# Patient Record
Sex: Female | Born: 1995 | Race: Black or African American | Hispanic: No | Marital: Single | Smoking: Never smoker
Health system: Southern US, Community
[De-identification: ages and names within clinical notes are randomized; demographics above are authoritative.]

## PROBLEM LIST (undated history)

## (undated) DIAGNOSIS — Z9109 Other allergy status, other than to drugs and biological substances: Secondary | ICD-10-CM

## (undated) DIAGNOSIS — O1490 Unspecified pre-eclampsia, unspecified trimester: Secondary | ICD-10-CM

## (undated) DIAGNOSIS — D649 Anemia, unspecified: Secondary | ICD-10-CM

## (undated) DIAGNOSIS — O09299 Supervision of pregnancy with other poor reproductive or obstetric history, unspecified trimester: Secondary | ICD-10-CM

## (undated) DIAGNOSIS — J45909 Unspecified asthma, uncomplicated: Secondary | ICD-10-CM

## (undated) HISTORY — DX: Supervision of pregnancy with other poor reproductive or obstetric history, unspecified trimester: O09.299

## (undated) HISTORY — PX: HIP ARTHROSCOPY: SHX668

## (undated) HISTORY — PX: WISDOM TOOTH EXTRACTION: SHX21

## (undated) HISTORY — DX: Anemia, unspecified: D64.9

---
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
## 2015-04-10 DIAGNOSIS — O1490 Unspecified pre-eclampsia, unspecified trimester: Secondary | ICD-10-CM

## 2015-04-10 HISTORY — DX: Unspecified pre-eclampsia, unspecified trimester: O14.90

## 2015-09-09 DIAGNOSIS — O1490 Unspecified pre-eclampsia, unspecified trimester: Secondary | ICD-10-CM

## 2017-12-17 LAB — OB RESULTS CONSOLE RPR: RPR: NONREACTIVE

## 2017-12-17 LAB — OB RESULTS CONSOLE RUBELLA ANTIBODY, IGM: Rubella: IMMUNE

## 2017-12-17 LAB — OB RESULTS CONSOLE GC/CHLAMYDIA
Chlamydia: NEGATIVE
Gonorrhea: NEGATIVE

## 2017-12-17 LAB — OB RESULTS CONSOLE HEPATITIS B SURFACE ANTIGEN: Hepatitis B Surface Ag: NEGATIVE

## 2017-12-17 LAB — OB RESULTS CONSOLE ABO/RH: RH Type: POSITIVE

## 2017-12-17 LAB — OB RESULTS CONSOLE ANTIBODY SCREEN: Antibody Screen: NEGATIVE

## 2017-12-17 LAB — OB RESULTS CONSOLE HIV ANTIBODY (ROUTINE TESTING): HIV: NONREACTIVE

## 2017-12-18 LAB — HM PAP SMEAR: HM Pap smear: NEGATIVE

## 2018-02-11 ENCOUNTER — Other Ambulatory Visit (HOSPITAL_COMMUNITY): Payer: Self-pay | Admitting: Obstetrics and Gynecology

## 2018-02-11 ENCOUNTER — Encounter (HOSPITAL_COMMUNITY): Payer: Self-pay | Admitting: *Deleted

## 2018-02-11 DIAGNOSIS — O280 Abnormal hematological finding on antenatal screening of mother: Secondary | ICD-10-CM

## 2018-02-11 DIAGNOSIS — Z3A18 18 weeks gestation of pregnancy: Secondary | ICD-10-CM

## 2018-02-11 DIAGNOSIS — Z363 Encounter for antenatal screening for malformations: Secondary | ICD-10-CM

## 2018-02-12 ENCOUNTER — Ambulatory Visit (HOSPITAL_COMMUNITY)
Admission: RE | Admit: 2018-02-12 | Discharge: 2018-02-12 | Disposition: A | Discharge status: Home / Self Care | Payer: BLUE CROSS/BLUE SHIELD | Source: Ambulatory Visit | Attending: Obstetrics and Gynecology | Admitting: Obstetrics and Gynecology

## 2018-02-12 ENCOUNTER — Ambulatory Visit (HOSPITAL_COMMUNITY): Payer: Self-pay

## 2018-02-12 ENCOUNTER — Ambulatory Visit (HOSPITAL_BASED_OUTPATIENT_CLINIC_OR_DEPARTMENT_OTHER)
Admission: RE | Admit: 2018-02-12 | Discharge: 2018-02-12 | Disposition: A | Discharge status: Home / Self Care | Payer: BLUE CROSS/BLUE SHIELD | Source: Ambulatory Visit | Attending: Obstetrics and Gynecology | Admitting: Obstetrics and Gynecology

## 2018-02-12 ENCOUNTER — Encounter (HOSPITAL_COMMUNITY): Payer: Self-pay

## 2018-02-12 ENCOUNTER — Other Ambulatory Visit (HOSPITAL_COMMUNITY): Payer: Self-pay | Admitting: *Deleted

## 2018-02-12 DIAGNOSIS — O281 Abnormal biochemical finding on antenatal screening of mother: Secondary | ICD-10-CM | POA: Insufficient documentation

## 2018-02-12 DIAGNOSIS — Z3A17 17 weeks gestation of pregnancy: Secondary | ICD-10-CM | POA: Insufficient documentation

## 2018-02-12 DIAGNOSIS — O280 Abnormal hematological finding on antenatal screening of mother: Secondary | ICD-10-CM

## 2018-02-12 DIAGNOSIS — Z363 Encounter for antenatal screening for malformations: Secondary | ICD-10-CM | POA: Diagnosis not present

## 2018-02-12 DIAGNOSIS — O09292 Supervision of pregnancy with other poor reproductive or obstetric history, second trimester: Secondary | ICD-10-CM | POA: Diagnosis not present

## 2018-02-12 DIAGNOSIS — O289 Unspecified abnormal findings on antenatal screening of mother: Secondary | ICD-10-CM | POA: Diagnosis present

## 2018-02-12 DIAGNOSIS — O351XX0 Maternal care for (suspected) chromosomal abnormality in fetus, not applicable or unspecified: Secondary | ICD-10-CM | POA: Diagnosis present

## 2018-02-12 DIAGNOSIS — Z3A18 18 weeks gestation of pregnancy: Secondary | ICD-10-CM

## 2018-02-12 DIAGNOSIS — O3510X0 Maternal care for (suspected) chromosomal abnormality in fetus, unspecified, not applicable or unspecified: Secondary | ICD-10-CM

## 2018-02-12 NOTE — ED Notes (Signed)
Patient in session with Genetic Counselor, Derek Jack.

## 2018-02-12 NOTE — Consult Note (Signed)
Consultation:   Victoria Mills is a 22  yo African American female, G 2 P 1001 LMP 10/10/17 EDC 07/23/18 now @ 17 weeks seen in consultation as requested secondary to:  1) 1) Abnormal QUAD screen - 1/30 risk Trisomy 21    PREVIOUS OBSTETRICAL HISTORY:  1) 2017 - NSVD@ term, female, 7 lb 14 oz, complicated by pre-eclampsia     PREVIOUS GYN HISTORY:   Abnormal PAP - neg   GC - neg   Chlamydia - neg    Syphilis - neg   CAT - 12-13- x 28-30 x 5-7   Contraception - none    PREVIOUS MEDICAL HISTORY:  DM - neg   HTN - neg   Asthma - age 51   Thyroid - neg   Rheumatic Fever - neg    Heart - neg   Lung - neg   Liver - neg   Kidney - neg   Epilepsy - neg    TB - neg   Herpes - neg   UTI - neg        PREVIOUS SURGICAL HISTORY:  1) 2013 - Rt. Hip arthroscopy without complications    MEDICATIONS:  Prenatal Vitamins, Symbicort BID       ALLERGIES/REACTIONS:  None      HABITS:  Smoking - neg   Drinking - neg   Drugs - neg    PSYCHOSOCIAL:  Single; FOB is involved      PROFESSION:  UPS    FAMILY HISTORY:  DM - neg   HTN - F   Twins - cousins   Stillborns - neg    Birth Defects - neg   Mental Retardation - neg   Blood Dyscrasias - neg   Anesthesia Complications - neg    Genetic - neg          TRISOMY 21: Genetic counseling was then performed.  The association of Trisomy 67 with mental retardation and various birth defects was discussed.  The risks and benefits of CVS vs. amniocentesis including a 1 % loss for CVS vs. a 1/350 risk of pregnancy loss per amnio as well as management options were explained.   Results based on maternal serum screening are gestationally age dependent for accurate risk assessment.  80 % of fetuses with Trisomy 21 will have some marker for Trisomy 21 on U/S but 20 % will not.  Also discussed was the alternative non-invasive perinatal testing (NIPT) with cell free DNA which will identify 99.1 % of fetuses with Trisomy 21.  NIPT  desired.  IMPRESSIONS: 1) Abnormal QUAD Screen - 1/30 risk Trisomy 21    RECOMMENDATIONS:  1) NIPT 2) Completion of anatomy in 4 weeks             30 minutes spent in face-to-face consultation with greater than 50% of the time spent in counseling.    Thank you for utilizing our ultrasound and consultative services.  If I may be of any further service, please do not hesitate to contact me.  Sincerely,   Patsi Sears, MD Maternal-Fetal Medicine   Copy of report sent to practitioner/clinic.

## 2018-02-12 NOTE — ED Notes (Signed)
Patient declines genetic testing at this time. 

## 2018-02-13 ENCOUNTER — Ambulatory Visit (HOSPITAL_COMMUNITY): Admission: RE | Admit: 2018-02-13 | Payer: Managed Care, Other (non HMO) | Source: Ambulatory Visit

## 2018-02-20 ENCOUNTER — Other Ambulatory Visit (HOSPITAL_COMMUNITY): Payer: Self-pay

## 2018-02-20 ENCOUNTER — Encounter (HOSPITAL_COMMUNITY): Payer: Self-pay

## 2018-03-12 ENCOUNTER — Encounter (HOSPITAL_COMMUNITY): Payer: Self-pay

## 2018-03-12 ENCOUNTER — Other Ambulatory Visit (HOSPITAL_COMMUNITY): Payer: Self-pay | Admitting: *Deleted

## 2018-03-12 ENCOUNTER — Ambulatory Visit (HOSPITAL_COMMUNITY)
Admission: RE | Admit: 2018-03-12 | Discharge: 2018-03-12 | Disposition: A | Discharge status: Home / Self Care | Payer: BLUE CROSS/BLUE SHIELD | Source: Ambulatory Visit | Attending: Obstetrics and Gynecology | Admitting: Obstetrics and Gynecology

## 2018-03-12 DIAGNOSIS — O280 Abnormal hematological finding on antenatal screening of mother: Secondary | ICD-10-CM

## 2018-03-12 DIAGNOSIS — Z3A21 21 weeks gestation of pregnancy: Secondary | ICD-10-CM | POA: Insufficient documentation

## 2018-03-12 DIAGNOSIS — O281 Abnormal biochemical finding on antenatal screening of mother: Secondary | ICD-10-CM | POA: Insufficient documentation

## 2018-03-12 DIAGNOSIS — Z362 Encounter for other antenatal screening follow-up: Secondary | ICD-10-CM | POA: Diagnosis not present

## 2018-03-12 DIAGNOSIS — O09292 Supervision of pregnancy with other poor reproductive or obstetric history, second trimester: Secondary | ICD-10-CM | POA: Insufficient documentation

## 2018-04-09 NOTE — L&D Delivery Note (Signed)
Delivery Note   Patient Name: Victoria Mills DOB: [birth year] MRN: [id number]  Date of admission: 07/25/2018 Delivering MD: Dale   Date of delivery: 07/25/18 Type of delivery: SVD  Newborn Data: Live born female  Birth Weight:   APGAR: 8, 9  Newborn Delivery   Birth date/time:  07/25/2018 20:27:00 Delivery type:  Vaginal, Spontaneous       Dahlia Bailiff, 23 y.o., @ [redacted]w[redacted]d,  [id number], who was admitted for elective IOL. I was called to the room when she progressed +2 station in the second stage of labor.  She pushed for 20/min.  She delivered a viable infant, cephalic and restituted to the LOA position over an intact perineum.  A nuchal cord   was not identified. The baby was placed on maternal abdomen while initial step of NRP were perfmored (Dry, Stimulated, and warmed). Hat placed on baby for thermoregulation. Delayed cord clamping was performed for 2 minutes.  Cord double clamped and cut.  Cord cut by father. Apgar scores were 8 and 9. Prophylactic Pitocin was started in the third stage of labor for active management. The placenta delivered spontaneously, shultz, with a 3 vessel cord and was sent to LD.  Inspection revealed none. An examination of the vaginal vault and cervix was free from lacerations. The uterus was firm, bleeding stable. Placenta and umbilical artery blood gas were not sent.  There were no complications during the procedure.  Mom and baby skin to skin following delivery. Left in stable condition.   Maternal Info: Anesthesia :Epidural had been placed not not working after pt started pushing  Episiotomy: no Lacerations:  no Suture Repair: no Est. Blood Loss (mL):  109  Newborn Info:  Baby Sex: female Circumcision: N/A Name: Kynleigh APGAR (1 MIN): 8   APGAR (5 MINS): 9   APGAR (10 MINS):     Mom to postpartum.  Baby to Couplet care / Skin to Skin.  Dr Sallye Ober updated on delivery    [place], [place], NP-C 07/25/18 8:49 PM

## 2018-04-10 ENCOUNTER — Encounter (HOSPITAL_COMMUNITY): Payer: Self-pay

## 2018-04-10 ENCOUNTER — Ambulatory Visit (HOSPITAL_COMMUNITY)
Admission: RE | Admit: 2018-04-10 | Discharge: 2018-04-10 | Disposition: A | Discharge status: Home / Self Care | Payer: BLUE CROSS/BLUE SHIELD | Source: Ambulatory Visit | Attending: Obstetrics and Gynecology | Admitting: Obstetrics and Gynecology

## 2018-04-10 ENCOUNTER — Other Ambulatory Visit (HOSPITAL_COMMUNITY): Payer: Self-pay | Admitting: *Deleted

## 2018-04-10 DIAGNOSIS — Z362 Encounter for other antenatal screening follow-up: Secondary | ICD-10-CM | POA: Diagnosis not present

## 2018-04-10 DIAGNOSIS — O09292 Supervision of pregnancy with other poor reproductive or obstetric history, second trimester: Secondary | ICD-10-CM

## 2018-04-10 DIAGNOSIS — O280 Abnormal hematological finding on antenatal screening of mother: Secondary | ICD-10-CM

## 2018-04-10 DIAGNOSIS — O281 Abnormal biochemical finding on antenatal screening of mother: Secondary | ICD-10-CM | POA: Diagnosis present

## 2018-04-10 DIAGNOSIS — Z3A25 25 weeks gestation of pregnancy: Secondary | ICD-10-CM

## 2018-05-08 ENCOUNTER — Encounter (HOSPITAL_COMMUNITY): Payer: Self-pay

## 2018-05-08 ENCOUNTER — Ambulatory Visit (HOSPITAL_COMMUNITY): Payer: BLUE CROSS/BLUE SHIELD

## 2018-06-30 LAB — OB RESULTS CONSOLE GBS: GBS: NEGATIVE

## 2018-07-01 ENCOUNTER — Other Ambulatory Visit: Payer: Self-pay

## 2018-07-01 ENCOUNTER — Encounter (HOSPITAL_COMMUNITY): Payer: Self-pay

## 2018-07-01 ENCOUNTER — Inpatient Hospital Stay (HOSPITAL_COMMUNITY)
Admission: AD | Admit: 2018-07-01 | Discharge: 2018-07-01 | Disposition: A | Discharge status: Home / Self Care | Payer: BLUE CROSS/BLUE SHIELD | Attending: Obstetrics & Gynecology | Admitting: Obstetrics & Gynecology

## 2018-07-01 DIAGNOSIS — H539 Unspecified visual disturbance: Secondary | ICD-10-CM | POA: Insufficient documentation

## 2018-07-01 DIAGNOSIS — R51 Headache: Secondary | ICD-10-CM | POA: Insufficient documentation

## 2018-07-01 DIAGNOSIS — Z79899 Other long term (current) drug therapy: Secondary | ICD-10-CM | POA: Diagnosis not present

## 2018-07-01 DIAGNOSIS — O9989 Other specified diseases and conditions complicating pregnancy, childbirth and the puerperium: Secondary | ICD-10-CM

## 2018-07-01 DIAGNOSIS — H538 Other visual disturbances: Secondary | ICD-10-CM | POA: Diagnosis present

## 2018-07-01 DIAGNOSIS — M549 Dorsalgia, unspecified: Secondary | ICD-10-CM | POA: Insufficient documentation

## 2018-07-01 DIAGNOSIS — R109 Unspecified abdominal pain: Secondary | ICD-10-CM | POA: Insufficient documentation

## 2018-07-01 DIAGNOSIS — Z3689 Encounter for other specified antenatal screening: Secondary | ICD-10-CM

## 2018-07-01 DIAGNOSIS — Z7982 Long term (current) use of aspirin: Secondary | ICD-10-CM | POA: Insufficient documentation

## 2018-07-01 DIAGNOSIS — O26893 Other specified pregnancy related conditions, third trimester: Secondary | ICD-10-CM | POA: Diagnosis not present

## 2018-07-01 DIAGNOSIS — Z3A36 36 weeks gestation of pregnancy: Secondary | ICD-10-CM | POA: Insufficient documentation

## 2018-07-01 DIAGNOSIS — J45909 Unspecified asthma, uncomplicated: Secondary | ICD-10-CM | POA: Insufficient documentation

## 2018-07-01 DIAGNOSIS — R52 Pain, unspecified: Secondary | ICD-10-CM | POA: Diagnosis present

## 2018-07-01 LAB — CBC
HCT: 28.2 % — ABNORMAL LOW (ref 36.0–46.0)
Hemoglobin: 9.1 g/dL — ABNORMAL LOW (ref 12.0–15.0)
MCH: 27.2 pg (ref 26.0–34.0)
MCHC: 32.3 g/dL (ref 30.0–36.0)
MCV: 84.4 fL (ref 80.0–100.0)
NRBC: 0.3 % — AB (ref 0.0–0.2)
PLATELETS: 312 10*3/uL (ref 150–400)
RBC: 3.34 MIL/uL — ABNORMAL LOW (ref 3.87–5.11)
RDW: 12.5 % (ref 11.5–15.5)
WBC: 10.4 10*3/uL (ref 4.0–10.5)

## 2018-07-01 LAB — COMPREHENSIVE METABOLIC PANEL
ALT: 7 U/L (ref 0–44)
AST: 18 U/L (ref 15–41)
Albumin: 2.3 g/dL — ABNORMAL LOW (ref 3.5–5.0)
Alkaline Phosphatase: 128 U/L — ABNORMAL HIGH (ref 38–126)
Anion gap: 11 (ref 5–15)
CHLORIDE: 102 mmol/L (ref 98–111)
CO2: 22 mmol/L (ref 22–32)
Calcium: 8.6 mg/dL — ABNORMAL LOW (ref 8.9–10.3)
Creatinine, Ser: 0.55 mg/dL (ref 0.44–1.00)
GFR calc Af Amer: >60 mL/min (ref >60)
GFR calc non Af Amer: >60 mL/min (ref >60)
Glucose, Bld: 109 mg/dL — ABNORMAL HIGH (ref 70–99)
POTASSIUM: 3.4 mmol/L — AB (ref 3.5–5.1)
Sodium: 135 mmol/L (ref 135–145)
TOTAL PROTEIN: 6.1 g/dL — AB (ref 6.5–8.1)
Total Bilirubin: 0.7 mg/dL (ref 0.3–1.2)

## 2018-07-01 LAB — PROTEIN / CREATININE RATIO, URINE
Creatinine, Urine: 148.97 mg/dL
Protein Creatinine Ratio: 0.17 mg/mg{Cre} — ABNORMAL HIGH (ref 0.00–0.15)
TOTAL PROTEIN, URINE: 25 mg/dL

## 2018-07-01 NOTE — MAU Note (Signed)
First time at work, was having double vision, started seeing bright spots.  Started hurting all over. Back, upper chest, and stomach, was having small contractions.  Feet are kind of swollen.  Has hx of pre-eclampsia.  No problems with this preg, is taking daily baby aspirin. Has had a slight HA off and on, not currently. Denies epigastric pain? Sharp pain in rib cage

## 2018-07-01 NOTE — MAU Note (Signed)
Urine sent to lab 

## 2018-07-01 NOTE — MAU Provider Note (Addendum)
History     CSN: [id number]  Arrival date and time: 07/01/18 1747   None     Chief Complaint  Patient presents with  . visual changes  . Abdominal Pain  . Back Pain   HPI   Victoria Mills is 23 y.o. G47P1001 female at [redacted]w[redacted]d who presents for concern for Pre-Eclampsia. Patient with h/o Pre-E with prior pregnancy. No HTN this pregnancy. Takes baby ASA daily. She has a history of migraines. Today she had vision changes and headache. Both resolved without treatment and she is currently asymptomatic. She also complains of back pain and "pain all over." She receives her [facility] with [facility].  OB History    Gravida  2   Para  1   Term  1   Preterm      AB      Living  1     SAB      TAB      Ectopic      Multiple      Live Births              Past Medical History:  Diagnosis Date  . Asthma   . Environmental allergies     Past Surgical History:  Procedure Laterality Date  . HIP ARTHROSCOPY      History reviewed. No pertinent family history.  Social History   Tobacco Use  . Smoking status: Never Smoker  . Smokeless tobacco: Never Used  Substance Use Topics  . Alcohol use: No  . Drug use: No    Allergies: No Known Allergies  Medications Prior to Admission  Medication Sig Dispense Refill Last Dose  . ALBUTEROL IN Inhale into the lungs.   07/01/2018 at Unknown time  . Budesonide-Formoterol Fumarate (SYMBICORT IN) Inhale into the lungs.   07/01/2018 at Unknown time  . LORATADINE PO Take by mouth.   07/01/2018 at Unknown time  . Prenatal Vit-Fe Fumarate-FA (PRENATAL VITAMIN PO) Take by mouth.   07/01/2018 at Unknown time  . meloxicam (MOBIC) 15 MG tablet Take 1 tablet (15 mg total) by mouth daily as needed for pain. (Patient not taking: Reported on 02/12/2018) 30 tablet 0 More than a month at Unknown time  . minocycline (MINOCIN) 100 MG capsule Take 1 capsule (100 mg total) by mouth 2 (two) times daily. (Patient not taking: Reported on 02/12/2018) 20 capsule 0 More  than a month at Unknown time    Review of Systems  Constitutional: Negative for chills and fever.  Eyes: Positive for visual disturbance.  Respiratory: Negative for cough and shortness of breath.   Cardiovascular: Negative for chest pain.  Gastrointestinal: Negative for abdominal pain.  Genitourinary: Negative for dysuria, flank pain, vaginal bleeding and vaginal discharge.  Musculoskeletal: Positive for back pain.  Neurological: Positive for headaches. Negative for dizziness, seizures and light-headedness.   Physical Exam   Blood pressure 121/62, pulse 88, temperature 98 F (36.7 C), temperature source Oral, resp. rate 16, weight 80.3 kg, last menstrual period 10/10/2017, SpO2 98 %.  Physical Exam  Constitutional: She is oriented to person, place, and time. She appears well-developed. No distress.  HENT:  Head: Normocephalic and atraumatic.  Eyes: Conjunctivae and EOM are normal.  Neck: Normal range of motion. Neck supple.  Cardiovascular: Normal rate, regular rhythm and normal heart sounds.  Respiratory: Effort normal and breath sounds normal. No respiratory distress.  GI: Soft. She exhibits no distension. There is no abdominal tenderness. There is no rebound and no guarding.  Gravid  Musculoskeletal: Normal range of motion.     Comments: 1+ pitting edema to shins bilaterally.   Neurological: She is alert and oriented to person, place, and time. She exhibits normal muscle tone.  Skin: Skin is warm and dry. She is not diaphoretic.  Psychiatric: She has a normal mood and affect. Her behavior is normal.   NST - FHR: 135 bpm / moderate variability / accels present / decels absent / TOCO: irregular UC's every 4-7 mins   MAU Course  Procedures  MDM NST reactive.  Serial BPs. Obtain CMET, CBC, and UPC.   Results for orders placed or performed during the hospital encounter of 07/01/18 (from the past 24 hour(s))  Protein / creatinine ratio, urine     Status: Abnormal    Collection Time: 07/01/18  7:03 PM  Result Value Ref Range   Creatinine, Urine 148.97 mg/dL   Total Protein, Urine 25 mg/dL   Protein Creatinine Ratio 0.17 (H) 0.00 - 0.15 mg/mg[Cre]  Comprehensive metabolic panel     Status: Abnormal   Collection Time: 07/01/18  7:11 PM  Result Value Ref Range   Sodium 135 135 - 145 mmol/L   Potassium 3.4 (L) 3.5 - 5.1 mmol/L   Chloride 102 98 - 111 mmol/L   CO2 22 22 - 32 mmol/L   Glucose, Bld 109 (H) 70 - 99 mg/dL   BUN <5 (L) 6 - 20 mg/dL   Creatinine, Ser 1.61 0.44 - 1.00 mg/dL   Calcium 8.6 (L) 8.9 - 10.3 mg/dL   Total Protein 6.1 (L) 6.5 - 8.1 g/dL   Albumin 2.3 (L) 3.5 - 5.0 g/dL   AST 18 15 - 41 U/L   ALT 7 0 - 44 U/L   Alkaline Phosphatase 128 (H) 38 - 126 U/L   Total Bilirubin 0.7 0.3 - 1.2 mg/dL   GFR calc non Af Amer >60 >60 mL/min   GFR calc Af Amer >60 >60 mL/min   Anion gap 11 5 - 15  CBC     Status: Abnormal   Collection Time: 07/01/18  7:11 PM  Result Value Ref Range   WBC 10.4 4.0 - 10.5 K/uL   RBC 3.34 (L) 3.87 - 5.11 MIL/uL   Hemoglobin 9.1 (L) 12.0 - 15.0 g/dL   HCT 09.6 (L) 04.5 - 40.9 %   MCV 84.4 80.0 - 100.0 fL   MCH 27.2 26.0 - 34.0 pg   MCHC 32.3 30.0 - 36.0 g/dL   RDW 81.1 91.4 - 78.2 %   Platelets 312 150 - 400 K/uL   nRBC 0.3 (H) 0.0 - 0.2 %    7:41 PM Patient signed out to [employer], CNM.   De Hollingshead 07/01/2018, 7:03 PM   Assessment and Plan  Back pain affecting pregnancy in third trimester  - Advised to take Tylenol for back pain - Information provided on back pain in pregnancy  NST (non-stress test) reactive - Reassurance that baby is doing well on monitor  - Discharge patient - Advised to call [facility] for any signs/sx's of PEC - Keep scheduled appt with [facility] on 07/07/2018 - Patient verbalized an understanding of the plan of care and agrees.   Raelyn Mora, CNM 07/01/2018 9:09 PM

## 2018-07-13 ENCOUNTER — Other Ambulatory Visit: Payer: Self-pay

## 2018-07-13 ENCOUNTER — Encounter (HOSPITAL_COMMUNITY): Payer: Self-pay

## 2018-07-13 ENCOUNTER — Inpatient Hospital Stay (HOSPITAL_COMMUNITY)
Admission: AD | Admit: 2018-07-13 | Discharge: 2018-07-13 | Disposition: A | Discharge status: Home / Self Care | Payer: BLUE CROSS/BLUE SHIELD | Source: Ambulatory Visit | Attending: Obstetrics & Gynecology | Admitting: Obstetrics & Gynecology

## 2018-07-13 DIAGNOSIS — O9989 Other specified diseases and conditions complicating pregnancy, childbirth and the puerperium: Secondary | ICD-10-CM | POA: Diagnosis not present

## 2018-07-13 DIAGNOSIS — O26893 Other specified pregnancy related conditions, third trimester: Secondary | ICD-10-CM

## 2018-07-13 DIAGNOSIS — Z3A38 38 weeks gestation of pregnancy: Secondary | ICD-10-CM | POA: Diagnosis not present

## 2018-07-13 DIAGNOSIS — O99513 Diseases of the respiratory system complicating pregnancy, third trimester: Secondary | ICD-10-CM | POA: Insufficient documentation

## 2018-07-13 DIAGNOSIS — R030 Elevated blood-pressure reading, without diagnosis of hypertension: Secondary | ICD-10-CM | POA: Insufficient documentation

## 2018-07-13 DIAGNOSIS — J45909 Unspecified asthma, uncomplicated: Secondary | ICD-10-CM | POA: Diagnosis not present

## 2018-07-13 DIAGNOSIS — O471 False labor at or after 37 completed weeks of gestation: Secondary | ICD-10-CM | POA: Insufficient documentation

## 2018-07-13 DIAGNOSIS — O479 False labor, unspecified: Secondary | ICD-10-CM

## 2018-07-13 HISTORY — DX: Unspecified pre-eclampsia, unspecified trimester: O14.90

## 2018-07-13 LAB — COMPREHENSIVE METABOLIC PANEL
ALT: 7 U/L (ref 0–44)
AST: 22 U/L (ref 15–41)
Albumin: 2.4 g/dL — ABNORMAL LOW (ref 3.5–5.0)
Alkaline Phosphatase: 129 U/L — ABNORMAL HIGH (ref 38–126)
Anion gap: 7 (ref 5–15)
BUN: <5 mg/dL — ABNORMAL LOW (ref 6–20)
CO2: 21 mmol/L — ABNORMAL LOW (ref 22–32)
Calcium: 8.8 mg/dL — ABNORMAL LOW (ref 8.9–10.3)
Chloride: 108 mmol/L (ref 98–111)
Creatinine, Ser: 0.61 mg/dL (ref 0.44–1.00)
GFR calc Af Amer: >60 mL/min (ref >60)
GFR calc non Af Amer: >60 mL/min (ref >60)
Glucose, Bld: 85 mg/dL (ref 70–99)
Potassium: 3.7 mmol/L (ref 3.5–5.1)
Sodium: 136 mmol/L (ref 135–145)
Total Bilirubin: 0.4 mg/dL (ref 0.3–1.2)
Total Protein: 5.8 g/dL — ABNORMAL LOW (ref 6.5–8.1)

## 2018-07-13 LAB — URINALYSIS, ROUTINE W REFLEX MICROSCOPIC
Bilirubin Urine: NEGATIVE
Glucose, UA: NEGATIVE mg/dL
Hgb urine dipstick: NEGATIVE
Ketones, ur: NEGATIVE mg/dL
Nitrite: NEGATIVE
Protein, ur: NEGATIVE mg/dL
Specific Gravity, Urine: <1.005 — ABNORMAL LOW (ref 1.005–1.030)
pH: 6 (ref 5.0–8.0)

## 2018-07-13 LAB — CBC
HCT: 29.3 % — ABNORMAL LOW (ref 36.0–46.0)
Hemoglobin: 8.9 g/dL — ABNORMAL LOW (ref 12.0–15.0)
MCH: 25.1 pg — ABNORMAL LOW (ref 26.0–34.0)
MCHC: 30.4 g/dL (ref 30.0–36.0)
MCV: 82.8 fL (ref 80.0–100.0)
Platelets: 321 10*3/uL (ref 150–400)
RBC: 3.54 MIL/uL — ABNORMAL LOW (ref 3.87–5.11)
RDW: 12.9 % (ref 11.5–15.5)
WBC: 9.2 10*3/uL (ref 4.0–10.5)
nRBC: 0.3 % — ABNORMAL HIGH (ref 0.0–0.2)

## 2018-07-13 LAB — PROTEIN / CREATININE RATIO, URINE
Creatinine, Urine: 65.52 mg/dL
Protein Creatinine Ratio: 0.15 mg/mg{Cre} (ref 0.00–0.15)
Total Protein, Urine: 10 mg/dL

## 2018-07-13 LAB — URINALYSIS, MICROSCOPIC (REFLEX): RBC / HPF: NONE SEEN RBC/hpf (ref 0–5)

## 2018-07-13 NOTE — Discharge Instructions (Signed)
Hypertension During Pregnancy ° °Hypertension, commonly called high blood pressure, is when the force of blood pumping through your arteries is too strong. Arteries are blood vessels that carry blood from the heart throughout the body. Hypertension during pregnancy can cause problems for you and your baby. Your baby may be born early (prematurely) or may not weigh as much as he or she should at birth. Very bad cases of hypertension during pregnancy can be life-threatening. °Different types of hypertension can occur during pregnancy. These include: °· Chronic hypertension. This happens when: °? You have hypertension before pregnancy and it continues during pregnancy. °? You develop hypertension before you are [redacted] weeks pregnant, and it continues during pregnancy. °· Gestational hypertension. This is hypertension that develops after the 20th week of pregnancy. °· Preeclampsia, also called toxemia of pregnancy. This is a very serious type of hypertension that develops during pregnancy. It can be very dangerous for you and your baby. °? In rare cases, you may develop preeclampsia after giving birth (postpartum preeclampsia). This usually occurs within 48 hours after childbirth but may occur up to 6 weeks after giving birth. °Gestational hypertension and preeclampsia usually go away within 6 weeks after your baby is born. Women who have hypertension during pregnancy have a greater chance of developing hypertension later in life or during future pregnancies. °What are the causes? °The exact cause of hypertension during pregnancy is not known. °What increases the risk? °There are certain factors that make it more likely for you to develop hypertension during pregnancy. These include: °· Having hypertension during a previous pregnancy or prior to pregnancy. °· Being overweight. °· Being age 35 or older. °· Being pregnant for the first time. °· Being pregnant with more than one baby. °· Becoming pregnant using fertilization  methods such as IVF (in vitro fertilization). °· Having diabetes, kidney problems, or systemic lupus erythematosus. °· Having a family history of hypertension. °What are the signs or symptoms? °Chronic hypertension and gestational hypertension rarely cause symptoms. Preeclampsia causes symptoms, which may include: °· Increased protein in your urine. Your health care provider will check for this at every visit before you give birth (prenatal visit). °· Severe headaches. °· Sudden weight gain. °· Swelling of the hands, face, legs, and feet. °· Nausea and vomiting. °· Vision problems, such as blurred or double vision. °· Numbness in the face, arms, legs, and feet. °· Dizziness. °· Slurred speech. °· Sensitivity to bright lights. °· Abdominal pain. °· Convulsions or seizures. °How is this diagnosed? °You may be diagnosed with hypertension during a routine prenatal exam. At each prenatal visit, you may: °· Have a urine test to check for high amounts of protein in your urine. °· Have your blood pressure checked. A blood pressure reading is given as two numbers, such as "120 over 80" (or 120/80). The first ("top") number is a measure of the pressure in your arteries when your heart beats (systolic pressure). The second ("bottom") number is a measure of the pressure in your arteries as your heart relaxes between beats (diastolic pressure). Blood pressure is measured in a unit called mm Hg. For most women, a normal blood pressure reading is: °? Systolic: below 120. °? Diastolic: below 80. °The type of hypertension that you are diagnosed with depends on your test results and when your symptoms developed. °· Chronic hypertension is usually diagnosed before 20 weeks of pregnancy. °· Gestational hypertension is usually diagnosed after 20 weeks of pregnancy. °· Hypertension with high amounts of protein in   the urine is diagnosed as preeclampsia. °· Blood pressure measurements that stay above 160 systolic, or above 110 diastolic,  are signs of severe preeclampsia. °How is this treated? °Treatment for hypertension during pregnancy varies depending on the type of hypertension you have and how serious it is. °· If you take medicines called ACE inhibitors to treat chronic hypertension, you may need to switch medicines. ACE inhibitors should not be taken during pregnancy. °· If you have gestational hypertension, you may need to take blood pressure medicine. °· If you are at risk for preeclampsia, your health care provider may recommend that you take a low-dose aspirin during your pregnancy. °· If you have severe preeclampsia, you may need to be hospitalized so you and your baby can be monitored closely. You may also need to take medicine (magnesium sulfate) to prevent seizures and to lower blood pressure. This medicine may be given as an injection or through an IV. °· In some cases, if your condition gets worse, you may need to deliver your baby early. °Follow these instructions at home: °Eating and drinking ° °· Drink enough fluid to keep your urine pale yellow. °· Avoid caffeine. °Lifestyle °· Do not use any products that contain nicotine or tobacco, such as cigarettes and e-cigarettes. If you need help quitting, ask your health care provider. °· Do not use alcohol or drugs. °· Avoid stress as much as possible. Rest and get plenty of sleep. °General instructions °· Take over-the-counter and prescription medicines only as told by your health care provider. °· While lying down, lie on your left side. This keeps pressure off your major blood vessels. °· While sitting or lying down, raise (elevate) your feet. Try putting some pillows under your lower legs. °· Exercise regularly. Ask your health care provider what kinds of exercise are best for you. °· Keep all prenatal and follow-up visits as told by your health care provider. This is important. °Contact a health care provider if: °· You have symptoms that your health care provider told you may  require more treatment or monitoring, such as: °? Nausea or vomiting. °? Headache. °Get help right away if you have: °· Severe abdominal pain that does not get better with treatment. °· A severe headache that does not get better. °· Vomiting that does not get better. °· Sudden, rapid weight gain. °· Sudden swelling in your hands, ankles, or face. °· Vaginal bleeding. °· Blood in your urine. °· Fewer movements from your baby than usual. °· Blurred or double vision. °· Muscle twitching or sudden muscle tightening (spasms). °· Shortness of breath. °· Blue fingernails or lips. °Summary °· Hypertension, commonly called high blood pressure, is when the force of blood pumping through your arteries is too strong. °· Hypertension during pregnancy can cause problems for you and your baby. °· Treatment for hypertension during pregnancy varies depending on the type of hypertension you have and how serious it is. °· Get help right away if you have symptoms that your health care provider told you to watch for. °This information is not intended to replace advice given to you by your health care provider. Make sure you discuss any questions you have with your health care provider. °Document Released: 12/12/2010 Document Revised: 03/12/2017 Document Reviewed: 09/09/2015 °Elsevier Interactive Patient Education © 2019 [employer] ° °Fetal Movement Counts °Patient Name: ________________________________________________ Patient Due Date: ____________________ °What is a fetal movement count? ° °A fetal movement count is the number of times that you feel your baby move   during a certain amount of time. This may also be called a fetal kick count. A fetal movement count is recommended for every pregnant woman. You may be asked to start counting fetal movements as early as week 28 of your pregnancy. °Pay attention to when your baby is most active. You may notice your baby's sleep and wake cycles. You may also notice things that make your  baby move more. You should do a fetal movement count: °· When your baby is normally most active. °· At the same time each day. °A good time to count movements is while you are resting, after having something to eat and drink. °How do I count fetal movements? °1. Find a quiet, comfortable area. Sit, or lie down on your side. °2. Write down the date, the start time and stop time, and the number of movements that you felt between those two times. Take this information with you to your health care visits. °3. For 2 hours, count kicks, flutters, swishes, rolls, and jabs. You should feel at least 10 movements during 2 hours. °4. You may stop counting after you have felt 10 movements. °5. If you do not feel 10 movements in 2 hours, have something to eat and drink. Then, keep resting and counting for 1 hour. If you feel at least 4 movements during that hour, you may stop counting. °Contact a health care provider if: °· You feel fewer than 4 movements in 2 hours. °· Your baby is not moving like he or she usually does. °Date: ____________ Start time: ____________ Stop time: ____________ Movements: ____________ °Date: ____________ Start time: ____________ Stop time: ____________ Movements: ____________ °Date: ____________ Start time: ____________ Stop time: ____________ Movements: ____________ °Date: ____________ Start time: ____________ Stop time: ____________ Movements: ____________ °Date: ____________ Start time: ____________ Stop time: ____________ Movements: ____________ °Date: ____________ Start time: ____________ Stop time: ____________ Movements: ____________ °Date: ____________ Start time: ____________ Stop time: ____________ Movements: ____________ °Date: ____________ Start time: ____________ Stop time: ____________ Movements: ____________ °Date: ____________ Start time: ____________ Stop time: ____________ Movements: ____________ °This information is not intended to replace advice given to you by your health care  provider. Make sure you discuss any questions you have with your health care provider. °Document Released: 04/25/2006 Document Revised: 11/23/2015 Document Reviewed: 05/05/2015 °Elsevier Interactive Patient Education © 2019 [employer] ° °

## 2018-07-13 NOTE — MAU Note (Signed)
Victoria Mills is a 23 y.o. at [redacted]w[redacted]d here in MAU reporting: states her ctx got really bad last night. States they are about every 8-10 min. States she has also been having a random pain under her right breast, pain is intermittent. No bleeding, no LOF, states she has not felt any fetal movement since sometime yesterday - "pretty sure last night".  Onset of complaint: this AM   Pain score: ctx 6/10, RUQ 9/10  Vitals:   07/13/18 1129  BP: 139/74  Pulse: 85  Resp: 18  Temp: 97.9 F (36.6 C)  SpO2: 100%      FHT:150  Lab orders placed from triage: none

## 2018-07-13 NOTE — MAU Provider Note (Signed)
Chief Complaint:  Abdominal Pain; Contractions; and Decreased Fetal Movement   First Provider Initiated Contact with Patient 07/13/18 1207     HPI: Victoria Mills is a 23 y.o. G2P1001 at [redacted]w[redacted]d who presents to maternity admissions reporting contractions & RUQ pain.  Reports contractions every 8-10 minutes since last night. Also reports RUQ pain that is intermittent but doesn't come with the contractions.  History of preeclampsia in both of her previous pregnancies.  Denies headache or visual disturbance.   Decreased fetal movement since last night. States she's unsure if she wasn't paying attention to movement d/t pain or if baby just wasn't moving. Endorses feeling movement since arriving to MAU.   Denies LOF or vaginal bleeding.   Location: abdomen Quality: contractions, sharp Severity: 8/10 in pain scale Duration: 1 day Timing: every 8-10 minutes Modifying factors: none Associated signs and symptoms: none  Past Medical History:  Diagnosis Date  . Asthma   . Environmental allergies   . Preeclampsia 2017   OB History  Gravida Para Term Preterm AB Living  SAB TAB Ectopic Multiple Live Births          1    # Outcome Date GA Lbr Len/2nd Weight Sex Delivery Anes PTL Lv  3 Current           2 Term 2017     Vag-Spont        Complications: Preeclampsia  1 Preterm 2015 [redacted]w[redacted]d   M Vag-Spont  N DEC     Complications: Preeclampsia    Obstetric Comments  2015- induced at 29 wks for preeclampsia. Baby died at 11 months d/t hypoplastic lung   Past Surgical History:  Procedure Laterality Date  . HIP ARTHROSCOPY     History reviewed. No pertinent family history. Social History   Tobacco Use  . Smoking status: Never Smoker  . Smokeless tobacco: Never Used  Substance Use Topics  . Alcohol use: No  . Drug use: No   No Known Allergies Medications Prior to Admission  Medication Sig Dispense Refill Last Dose  . ALBUTEROL IN Inhale into the lungs.   07/01/2018 at Unknown  time  . Budesonide-Formoterol Fumarate (SYMBICORT IN) Inhale into the lungs.   07/01/2018 at Unknown time  . LORATADINE PO Take by mouth.   07/01/2018 at Unknown time  . Prenatal Vit-Fe Fumarate-FA (PRENATAL VITAMIN PO) Take by mouth.   07/01/2018 at Unknown time    I have reviewed patient's Past Medical Hx, Surgical Hx, Family Hx, Social Hx, medications and allergies.   ROS:  Review of Systems  Constitutional: Negative.   Eyes: Negative for visual disturbance.  Gastrointestinal: Positive for abdominal pain. Negative for diarrhea, nausea and vomiting.  Genitourinary: Negative.   Neurological: Negative for headaches.    Physical Exam   Patient Vitals for the past 24 hrs:  BP Temp Temp src Pulse Resp SpO2 Height Weight  07/13/18 1400 139/84 - - 87 - 97 % - -  07/13/18 1350 - - - - - 97 % - -  07/13/18 1346 (!) 141/79 - - 82 - - - -  07/13/18 1331 131/74 - - 85 - - - -  07/13/18 1316 135/72 - - 71 - - - -  07/13/18 1300 131/81 - - 89 - 97 % - -  07/13/18 1245 128/76 - - 88 - 97 % - -  07/13/18 1234 127/87 - - 99 - - - -  07/13/18 1224 136/79 - -  80 - - - -  07/13/18 1149 (!) 147/84 - - 92 - - - -  07/13/18 1129 139/74 97.9 F (36.6 C) Oral 85 18 100 % - -  07/13/18 1123 - - - - - - 5' 3.5" (1.613 m) 79.2 kg    Constitutional: Well-developed, well-nourished female in no acute distress.  Cardiovascular: normal rate & rhythm, no murmur Respiratory: normal effort, lung sounds clear throughout GI: Abd soft, non-tender, gravid appropriate for gestational age. Pos BS x 4 MS: Extremities nontender, no edema, normal ROM Neurologic: Alert and oriented x 4. DTR 2+. No clonus.     Dilation: 1.5 Effacement (%): 60 Cervical Position: Posterior Station: -3 Presentation: Vertex Exam by:: AMariel Sleet, RN  NST:  Baseline: 135 bpm, Variability: Good {> 6 bpm), Accelerations: Reactive and Decelerations: Variable: mild x 1 at beginning of tracing with quick return to baseline    Labs: Results for orders placed or performed during the hospital encounter of 07/13/18 (from the past 24 hour(s))  Protein / creatinine ratio, urine     Status: None   Collection Time: 07/13/18 12:38 PM  Result Value Ref Range   Creatinine, Urine 65.52 mg/dL   Total Protein, Urine 10 mg/dL   Protein Creatinine Ratio 0.15 0.00 - 0.15 mg/mg[Cre]  Urinalysis, Routine w reflex microscopic     Status: Abnormal   Collection Time: 07/13/18 12:38 PM  Result Value Ref Range   Color, Urine YELLOW (A) YELLOW   APPearance CLEAR (A) CLEAR   Specific Gravity, Urine <1.005 (L) 1.005 - 1.030   pH 6.0 5.0 - 8.0   Glucose, UA NEGATIVE NEGATIVE mg/dL   Hgb urine dipstick NEGATIVE NEGATIVE   Bilirubin Urine NEGATIVE NEGATIVE   Ketones, ur NEGATIVE NEGATIVE mg/dL   Protein, ur NEGATIVE NEGATIVE mg/dL   Nitrite NEGATIVE NEGATIVE   Leukocytes,Ua MODERATE (A) NEGATIVE  Urinalysis, Microscopic (reflex)     Status: Abnormal   Collection Time: 07/13/18 12:38 PM  Result Value Ref Range   RBC / HPF NONE SEEN 0 - 5 RBC/hpf   WBC, UA 6-10 0 - 5 WBC/hpf   Bacteria, UA FEW (A) NONE SEEN   Squamous Epithelial / LPF 6-10 0 - 5  CBC     Status: Abnormal   Collection Time: 07/13/18 12:53 PM  Result Value Ref Range   WBC 9.2 4.0 - 10.5 K/uL   RBC 3.54 (L) 3.87 - 5.11 MIL/uL   Hemoglobin 8.9 (L) 12.0 - 15.0 g/dL   HCT 81.8 (L) 56.3 - 14.9 %   MCV 82.8 80.0 - 100.0 fL   MCH 25.1 (L) 26.0 - 34.0 pg   MCHC 30.4 30.0 - 36.0 g/dL   RDW 70.2 63.7 - 85.8 %   Platelets 321 150 - 400 K/uL   nRBC 0.3 (H) 0.0 - 0.2 %  Comprehensive metabolic panel     Status: Abnormal   Collection Time: 07/13/18 12:53 PM  Result Value Ref Range   Sodium 136 135 - 145 mmol/L   Potassium 3.7 3.5 - 5.1 mmol/L   Chloride 108 98 - 111 mmol/L   CO2 21 (L) 22 - 32 mmol/L   Glucose, Bld 85 70 - 99 mg/dL   BUN <5 (L) 6 - 20 mg/dL   Creatinine, Ser 8.50 0.44 - 1.00 mg/dL   Calcium 8.8 (L) 8.9 - 10.3 mg/dL   Total Protein 5.8 (L) 6.5 -  8.1 g/dL   Albumin 2.4 (L) 3.5 - 5.0 g/dL   AST 22 15 -  41 U/L   ALT 7 0 - 44 U/L   Alkaline Phosphatase 129 (H) 38 - 126 U/L   Total Bilirubin 0.4 0.3 - 1.2 mg/dL   GFR calc non Af Amer >60 >60 mL/min   GFR calc Af Amer >60 >60 mL/min   Anion gap 7 5 - 15    Imaging:  No results found.  MAU Course: Orders Placed This Encounter  Procedures  . CBC  . Comprehensive metabolic panel  . Protein / creatinine ratio, urine  . Urinalysis, Routine w reflex microscopic  . Urinalysis, Microscopic (reflex)  . Contraction - monitoring  . External fetal heart monitoring  . Vaginal exam  . Discharge patient   No orders of the defined types were placed in this encounter.   MDM: Pt reports return of fetal movement while in MAU. Reactive NST  Inconsistently elevated BPs. None severe range. Patient with intermittent RUQ pain, otherwise asymptomatic. PEC labs normal. Per review of care everywhere & patient's prenatal record, no previously elevated BP recorded during this pregnancy. Pt has appt in office tomorrow; good f/u for BP check with diagnosis of GHTN if still elevated.   Irregular contractions on monitor & cervix unchanged after 2+ hours of monitoring.   Assessment: 1. Elevated BP without diagnosis of hypertension   2. [redacted] weeks gestation of pregnancy   3. False labor     Plan: Discharge home in stable condition.  PEC & Labor precautions and fetal kick counts  Follow-up Information    [facility] Follow up.   Specialty:  Obstetrics and Gynecology Why:  Keep scheduled appointment tomorrow Contact information: [address] [place] [address] [phone]          Allergies as of 07/13/2018   No Known Allergies     Medication List    TAKE these medications   ALBUTEROL IN Inhale into the lungs.   LORATADINE PO Take by mouth.   PRENATAL VITAMIN PO Take by mouth.   SYMBICORT IN Inhale into the  lungs.       Judeth Horn, NP 07/13/2018 2:22 PM

## 2018-07-23 ENCOUNTER — Other Ambulatory Visit: Payer: Self-pay | Admitting: Obstetrics & Gynecology

## 2018-07-24 ENCOUNTER — Encounter (HOSPITAL_COMMUNITY): Payer: Self-pay | Admitting: *Deleted

## 2018-07-24 ENCOUNTER — Other Ambulatory Visit (HOSPITAL_COMMUNITY): Payer: Self-pay | Admitting: *Deleted

## 2018-07-24 ENCOUNTER — Telehealth (HOSPITAL_COMMUNITY): Payer: Self-pay | Admitting: *Deleted

## 2018-07-24 NOTE — Telephone Encounter (Signed)
Preadmission screen  

## 2018-07-25 ENCOUNTER — Other Ambulatory Visit: Payer: Self-pay

## 2018-07-25 ENCOUNTER — Inpatient Hospital Stay (HOSPITAL_COMMUNITY): Payer: BLUE CROSS/BLUE SHIELD | Admitting: Anesthesiology

## 2018-07-25 ENCOUNTER — Inpatient Hospital Stay (HOSPITAL_COMMUNITY)
Admission: AD | Admit: 2018-07-25 | Discharge: 2018-07-27 | DRG: 807 | Disposition: A | Discharge status: Home / Self Care | Payer: BLUE CROSS/BLUE SHIELD | Attending: Obstetrics & Gynecology | Admitting: Obstetrics & Gynecology

## 2018-07-25 ENCOUNTER — Inpatient Hospital Stay (HOSPITAL_COMMUNITY): Payer: BLUE CROSS/BLUE SHIELD

## 2018-07-25 ENCOUNTER — Encounter (HOSPITAL_COMMUNITY): Payer: Self-pay | Admitting: *Deleted

## 2018-07-25 DIAGNOSIS — O134 Gestational [pregnancy-induced] hypertension without significant proteinuria, complicating childbirth: Principal | ICD-10-CM | POA: Diagnosis present

## 2018-07-25 DIAGNOSIS — J45909 Unspecified asthma, uncomplicated: Secondary | ICD-10-CM | POA: Diagnosis present

## 2018-07-25 DIAGNOSIS — Z3A40 40 weeks gestation of pregnancy: Secondary | ICD-10-CM | POA: Diagnosis not present

## 2018-07-25 DIAGNOSIS — O9081 Anemia of the puerperium: Secondary | ICD-10-CM | POA: Diagnosis not present

## 2018-07-25 DIAGNOSIS — O9952 Diseases of the respiratory system complicating childbirth: Secondary | ICD-10-CM | POA: Diagnosis present

## 2018-07-25 DIAGNOSIS — O139 Gestational [pregnancy-induced] hypertension without significant proteinuria, unspecified trimester: Secondary | ICD-10-CM | POA: Diagnosis not present

## 2018-07-25 DIAGNOSIS — O26893 Other specified pregnancy related conditions, third trimester: Secondary | ICD-10-CM | POA: Diagnosis present

## 2018-07-25 DIAGNOSIS — Z3490 Encounter for supervision of normal pregnancy, unspecified, unspecified trimester: Secondary | ICD-10-CM | POA: Diagnosis present

## 2018-07-25 LAB — CBC
HCT: 31.4 % — ABNORMAL LOW (ref 36.0–46.0)
HCT: 31.1 % — ABNORMAL LOW (ref 36.0–46.0)
Hemoglobin: 9.5 g/dL — ABNORMAL LOW (ref 12.0–15.0)
Hemoglobin: 9.6 g/dL — ABNORMAL LOW (ref 12.0–15.0)
MCH: 24.7 pg — ABNORMAL LOW (ref 26.0–34.0)
MCH: 25 pg — ABNORMAL LOW (ref 26.0–34.0)
MCHC: 30.3 g/dL (ref 30.0–36.0)
MCHC: 30.9 g/dL (ref 30.0–36.0)
MCV: 81.6 fL (ref 80.0–100.0)
MCV: 81 fL (ref 80.0–100.0)
Platelets: 264 10*3/uL (ref 150–400)
Platelets: 238 10*3/uL (ref 150–400)
RBC: 3.85 MIL/uL — ABNORMAL LOW (ref 3.87–5.11)
RBC: 3.84 MIL/uL — ABNORMAL LOW (ref 3.87–5.11)
RDW: 13.7 % (ref 11.5–15.5)
RDW: 13.8 % (ref 11.5–15.5)
WBC: 10.2 10*3/uL (ref 4.0–10.5)
WBC: 15.6 10*3/uL — ABNORMAL HIGH (ref 4.0–10.5)
nRBC: 0 % (ref 0.0–0.2)
nRBC: 0.3 % — ABNORMAL HIGH (ref 0.0–0.2)

## 2018-07-25 LAB — COMPREHENSIVE METABOLIC PANEL
ALT: 8 U/L (ref 0–44)
AST: 21 U/L (ref 15–41)
Albumin: 2.8 g/dL — ABNORMAL LOW (ref 3.5–5.0)
Alkaline Phosphatase: 158 U/L — ABNORMAL HIGH (ref 38–126)
Anion gap: 11 (ref 5–15)
BUN: <5 mg/dL — ABNORMAL LOW (ref 6–20)
CO2: 22 mmol/L (ref 22–32)
Calcium: 9.3 mg/dL (ref 8.9–10.3)
Chloride: 103 mmol/L (ref 98–111)
Creatinine, Ser: 0.59 mg/dL (ref 0.44–1.00)
GFR calc Af Amer: >60 mL/min (ref >60)
GFR calc non Af Amer: >60 mL/min (ref >60)
Glucose, Bld: 72 mg/dL (ref 70–99)
Potassium: 3.9 mmol/L (ref 3.5–5.1)
Sodium: 136 mmol/L (ref 135–145)
Total Bilirubin: 0.8 mg/dL (ref 0.3–1.2)
Total Protein: 6 g/dL — ABNORMAL LOW (ref 6.5–8.1)

## 2018-07-25 LAB — TYPE AND SCREEN
ABO/RH(D): O POS
Antibody Screen: NEGATIVE

## 2018-07-25 LAB — PROTEIN / CREATININE RATIO, URINE
Creatinine, Urine: 205.17 mg/dL
Protein Creatinine Ratio: 0.11 mg/mg{Cre} (ref 0.00–0.15)
Total Protein, Urine: 23 mg/dL

## 2018-07-25 LAB — LACTATE DEHYDROGENASE: LDH: 192 U/L (ref 98–192)

## 2018-07-25 LAB — URIC ACID: Uric Acid, Serum: 6.4 mg/dL (ref 2.5–7.1)

## 2018-07-25 MED ORDER — TERBUTALINE SULFATE 1 MG/ML IJ SOLN: 0.2500 mg | Freq: Once | INTRAMUSCULAR | Status: DC | PRN

## 2018-07-25 MED ORDER — LACTATED RINGERS IV SOLN: 500.0000 mL | Freq: Once | INTRAVENOUS | Status: DC

## 2018-07-25 MED ORDER — OXYTOCIN 40 UNITS IN NORMAL SALINE INFUSION - SIMPLE MED: 2.5000 [IU]/h | INTRAVENOUS | Status: DC

## 2018-07-25 MED ORDER — OXYTOCIN 40 UNITS IN NORMAL SALINE INFUSION - SIMPLE MED
1.0000 m[IU]/min | INTRAVENOUS | Status: DC
  Administered 2018-07-25: 2 m[IU]/min via INTRAVENOUS
  Filled 2018-07-25: qty 1000

## 2018-07-25 MED ORDER — SIMETHICONE 80 MG PO CHEW: 80.0000 mg | CHEWABLE_TABLET | ORAL | Status: DC | PRN

## 2018-07-25 MED ORDER — EPHEDRINE 5 MG/ML INJ: 10.0000 mg | INTRAVENOUS | Status: DC | PRN

## 2018-07-25 MED ORDER — PHENYLEPHRINE 40 MCG/ML (10ML) SYRINGE FOR IV PUSH (FOR BLOOD PRESSURE SUPPORT): 80.0000 ug | PREFILLED_SYRINGE | INTRAVENOUS | Status: DC | PRN

## 2018-07-25 MED ORDER — TETANUS-DIPHTH-ACELL PERTUSSIS 5-2.5-18.5 LF-MCG/0.5 IM SUSP: 0.5000 mL | Freq: Once | INTRAMUSCULAR | Status: DC

## 2018-07-25 MED ORDER — FLEET ENEMA 7-19 GM/118ML RE ENEM: 1.0000 | ENEMA | RECTAL | Status: DC | PRN

## 2018-07-25 MED ORDER — ONDANSETRON HCL 4 MG/2ML IJ SOLN: 4.0000 mg | INTRAMUSCULAR | Status: DC | PRN

## 2018-07-25 MED ORDER — DIBUCAINE (PERIANAL) 1 % EX OINT: 1.0000 "application " | TOPICAL_OINTMENT | CUTANEOUS | Status: DC | PRN

## 2018-07-25 MED ORDER — LACTATED RINGERS IV SOLN
INTRAVENOUS | Status: DC
  Administered 2018-07-25 (×2): via INTRAVENOUS

## 2018-07-25 MED ORDER — ONDANSETRON HCL 4 MG/2ML IJ SOLN: 4.0000 mg | Freq: Four times a day (QID) | INTRAMUSCULAR | Status: DC | PRN

## 2018-07-25 MED ORDER — IBUPROFEN 600 MG PO TABS: 600.0000 mg | ORAL_TABLET | Freq: Four times a day (QID) | ORAL | Status: DC

## 2018-07-25 MED ORDER — OXYTOCIN BOLUS FROM INFUSION
500.0000 mL | Freq: Once | INTRAVENOUS | Status: AC
  Administered 2018-07-25: 21:00:00 500 mL via INTRAVENOUS

## 2018-07-25 MED ORDER — SOD CITRATE-CITRIC ACID 500-334 MG/5ML PO SOLN: 30.0000 mL | ORAL | Status: DC | PRN

## 2018-07-25 MED ORDER — BENZOCAINE-MENTHOL 20-0.5 % EX AERO
1.0000 "application " | INHALATION_SPRAY | CUTANEOUS | Status: DC | PRN
  Administered 2018-07-26: 1 via TOPICAL
  Filled 2018-07-25: qty 56

## 2018-07-25 MED ORDER — LIDOCAINE HCL (PF) 1 % IJ SOLN
INTRAMUSCULAR | Status: DC | PRN
  Administered 2018-07-25 (×2): 5 mL via EPIDURAL

## 2018-07-25 MED ORDER — OXYCODONE HCL 5 MG PO TABS
10.0000 mg | ORAL_TABLET | Freq: Once | ORAL | Status: AC
  Administered 2018-07-25: 10 mg via ORAL
  Filled 2018-07-25: qty 2

## 2018-07-25 MED ORDER — FENTANYL CITRATE (PF) 100 MCG/2ML IJ SOLN: 50.0000 ug | INTRAMUSCULAR | Status: DC | PRN

## 2018-07-25 MED ORDER — ZOLPIDEM TARTRATE 5 MG PO TABS: 5.0000 mg | ORAL_TABLET | Freq: Every evening | ORAL | Status: DC | PRN

## 2018-07-25 MED ORDER — WITCH HAZEL-GLYCERIN EX PADS: 1.0000 "application " | MEDICATED_PAD | CUTANEOUS | Status: DC | PRN

## 2018-07-25 MED ORDER — DIPHENHYDRAMINE HCL 25 MG PO CAPS
25.0000 mg | ORAL_CAPSULE | Freq: Four times a day (QID) | ORAL | Status: DC | PRN
  Administered 2018-07-25: 25 mg via ORAL
  Filled 2018-07-25: qty 1

## 2018-07-25 MED ORDER — ACETAMINOPHEN 325 MG PO TABS: 650.0000 mg | ORAL_TABLET | ORAL | Status: DC | PRN

## 2018-07-25 MED ORDER — ACETAMINOPHEN 325 MG PO TABS
650.0000 mg | ORAL_TABLET | ORAL | Status: DC | PRN
  Filled 2018-07-25: qty 2

## 2018-07-25 MED ORDER — MISOPROSTOL 25 MCG QUARTER TABLET
25.0000 ug | ORAL_TABLET | ORAL | Status: DC | PRN
  Administered 2018-07-25: 25 ug via VAGINAL
  Filled 2018-07-25 (×2): qty 1

## 2018-07-25 MED ORDER — SENNOSIDES-DOCUSATE SODIUM 8.6-50 MG PO TABS
2.0000 | ORAL_TABLET | ORAL | Status: DC
  Administered 2018-07-27: 2 via ORAL
  Filled 2018-07-25: qty 2

## 2018-07-25 MED ORDER — ONDANSETRON HCL 4 MG PO TABS: 4.0000 mg | ORAL_TABLET | ORAL | Status: DC | PRN

## 2018-07-25 MED ORDER — COCONUT OIL OIL: 1.0000 "application " | TOPICAL_OIL | Status: DC | PRN

## 2018-07-25 MED ORDER — PHENYLEPHRINE 40 MCG/ML (10ML) SYRINGE FOR IV PUSH (FOR BLOOD PRESSURE SUPPORT)
80.0000 ug | PREFILLED_SYRINGE | INTRAVENOUS | Status: DC | PRN
  Filled 2018-07-25: qty 10

## 2018-07-25 MED ORDER — PRENATAL MULTIVITAMIN CH
1.0000 | ORAL_TABLET | Freq: Every day | ORAL | Status: DC
  Administered 2018-07-26: 14:00:00 1 via ORAL
  Filled 2018-07-25 (×2): qty 1

## 2018-07-25 MED ORDER — SODIUM CHLORIDE (PF) 0.9 % IJ SOLN
INTRAMUSCULAR | Status: DC | PRN
  Administered 2018-07-25: 12 mL/h via EPIDURAL

## 2018-07-25 MED ORDER — DIPHENHYDRAMINE HCL 50 MG/ML IJ SOLN: 12.5000 mg | INTRAMUSCULAR | Status: DC | PRN

## 2018-07-25 MED ORDER — FENTANYL-BUPIVACAINE-NACL 0.5-0.125-0.9 MG/250ML-% EP SOLN
12.0000 mL/h | EPIDURAL | Status: DC | PRN
  Filled 2018-07-25: qty 250

## 2018-07-25 MED ORDER — LACTATED RINGERS IV SOLN: 500.0000 mL | INTRAVENOUS | Status: DC | PRN

## 2018-07-25 MED ORDER — LIDOCAINE HCL (PF) 1 % IJ SOLN: 30.0000 mL | INTRAMUSCULAR | Status: DC | PRN

## 2018-07-25 NOTE — Progress Notes (Signed)
Labor Progress Note  Per HPI: Victoria Mills is a 23 y.o. female, G3P1101, IUP at 40.2 weeks, presenting for Elective IOL. H/O asthma and preE, (Last delivery, induced, no meds pp. Recommend ASA 81 mg daily. induced at 29wks) neg labs 01/30/2018, during this pregnancy, no dx this pregnancy, mss down syndrome risk in 1 in 30 for this pregnancy (INCREASED RISK OF DS ON QUAD SCREEN. 1/30. REFERRED TO MFM FOR ANATOMY US AND AND GENETIC COUNSELING. PT SEEN BY MFM AND GENETIC COUNSELORS COUNSELING. 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 "hypoplastic lungs", died at age 24 months.) Pt endorse + Fm. Denies vaginal leakage. Denies vaginal bleeding. Denies feeling cxt's. Baby female, gbs-.  Subjective: Pt comfortable in bed, anxious about delivery, partner at bedside, Reviewed AROM and pitocin risk and benefit, pt verbalized consent for both and denies any questions.  Patient Active Problem List   Diagnosis Date Noted  . Encounter for elective induction of labor 07/25/2018  . Blurry vision, bilateral 07/01/2018  . Generalized pain 07/01/2018  . 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 Abnormal findings on antenatal screening of mother 02/12/2018   Objective: BP (!) 148/83   Pulse 76   Temp 99 F (37.2 C) (Oral)   Resp 18   LMP 10/10/2017  No intake/output data recorded. No intake/output data recorded. NST: FHR baseline 145 bpm, Variability: moderate, Accelerations:present, Decelerations:  Absent= Cat 1/Reactive CTX:  irregular, every 2-4 minutes, lasting 60-80 seconds Uterus gravid, soft non tender, moderate to palpate with contractions.  SVE:  Dilation: 4 Effacement (%): 70 Station: -2 Exam by:: J.Aundray Cartlidge, CNM Pitocin at 2 mUn/min  Assessment:  Victoria BailiffJadon Mills is a 23 y.o. female, G3P1101, IUP at 40.2 weeks, presenting for Elective IOL.  H/O asthma and preE, (Last delivery, induced, no meds pp. Recommend ASA 81 mg daily. induced at 29wks) neg labs 01/30/2018, during this pregnancy, no dx this pregnancy, mss down syndrome risk in 1 in 30 for this pregnancy (INCREASED RISK OF DS ON QUAD SCREEN. 1/30. REFERRED TO MFM FOR ANATOMY US AND AND GENETIC COUNSELING. PT SEEN BY MFM AND GENETIC COUNSELORS COUNSELING. NIPT DONE AND US FOR ANATOMYUS FOR GROWTH Q 4 WEEKS START AT 24 WEEKS FETAL ECHO WNL.), h/o familial congential anomaly (Son born at 3129 weeks with ? "hypoplastic lungs", died at age 24 months.) Pt tolerated arom, endorses ready for epidural feeling cxt. Pt had x2 elevated BP x4 hours apart and is now dx with ghtn. PreE baseline labs neg, pcr 0.11.  Patient Active Problem List   Diagnosis Date Noted  . Encounter for elective induction of labor 07/25/2018  . Blurry vision, bilateral 07/01/2018  . Generalized pain 07/01/2018  . 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 Abnormal findings on antenatal screening of mother 02/12/2018   NICHD: Category 1  Membranes:  AROM , clear, small amount on 4/17 @ 1810 x 0hrs, no s/s of infection  Induction:    Cytotec x1 on 4/17 @ 1356  Pitocin - 2  Pain management:               IV pain management: x0             Epidural placement:  To be placed now   GBS negative  GHTN: Neg pre e labs, pcr 0.11, BP now  143/83, no s/sx.   Plan: Continue labor plan Continuous monitoring GHTN: Monitor BP. If SR will need to start magnesium 4gm loading dose.  Rest Frequent position changes to facilitate fetal rotation and descent. Will reassess with cervical exam in 4 hours earlier if necessary Continue pitocin per protocol Anticipate labor progression and vaginal delivery.   [facility] aware of plan and verbalized agreement.   Dale Wanette, NP-C, CNM, MSN 07/25/2018. 7:30 PM

## 2018-07-25 NOTE — Anesthesia Procedure Notes (Signed)
Epidural Patient location during procedure: OB Start time: 07/25/2018 7:12 PM End time: 07/25/2018 7:38 PM  Staffing Anesthesiologist: Jairo Ben, MD Performed: anesthesiologist   Preanesthetic Checklist Completed: patient identified, surgical consent, pre-op evaluation, timeout performed, IV checked, risks and benefits discussed and monitors and equipment checked  Epidural Patient position: sitting Prep: site prepped and draped and DuraPrep Patient monitoring: blood pressure, continuous pulse ox and heart rate Approach: midline Location: L2-L3 Injection technique: LOR air Procedures: ultrasound guided  Needle:  Needle type: Tuohy  Needle gauge: 17 G Needle length: 9 cm Needle insertion depth: 6 cm Catheter type: closed end flexible Catheter size: 19 Gauge Catheter at skin depth: 11 cm Test dose: negative (1% lidocaine)  Assessment Events: blood not aspirated, injection not painful, no injection resistance, negative IV test and no paresthesia  Additional Notes Pt identified in Labor room.  Monitors applied. Working IV access confirmed. Sterile prep, drape lumbar spine.  1% lido local L 2,3.  #17ga Touhy LOR air at 6 cm L 2,3, cath in easily to 11 cm skin. Test dose OK, cath dosed and infusion begun.  Patient asymptomatic, VSS, no heme aspirated, tolerated well.  Sandford Craze, MDReason for block:procedure for pain

## 2018-07-25 NOTE — Anesthesia Preprocedure Evaluation (Addendum)
Anesthesia Evaluation  Patient identified by MRN, date of birth, ID band Patient awake    Reviewed: Allergy & Precautions, NPO status , Patient's Chart, lab work & pertinent test results  History of Anesthesia Complications Negative for: history of anesthetic complications  Airway Mallampati: II  TM Distance: >3 FB Neck ROM: Full    Dental  (+) Dental Advisory Given   Pulmonary asthma (inhaler needed 2 days ago) ,    breath sounds clear to auscultation       Cardiovascular hypertension (gestational HTN), (-) angina Rhythm:Regular Rate:Normal     Neuro/Psych negative neurological ROS     GI/Hepatic Neg liver ROS, GERD  ,  Endo/Other  negative endocrine ROS  Renal/GU negative Renal ROS     Musculoskeletal   Abdominal   Peds  Hematology  (+) Blood dyscrasia (Hb 9.5, plt 264k), ,   Anesthesia Other Findings   Reproductive/Obstetrics (+) Pregnancy                            Anesthesia Physical Anesthesia Plan  ASA: III  Anesthesia Plan: Epidural   Post-op Pain Management:    Induction:   PONV Risk Score and Plan: 2 and Treatment may vary due to age or medical condition  Airway Management Planned: Natural Airway  Additional Equipment:   Intra-op Plan:   Post-operative Plan:   Informed Consent: I have reviewed the patients History and Physical, chart, labs and discussed the procedure including the risks, benefits and alternatives for the proposed anesthesia with the patient or authorized representative who has indicated his/her understanding and acceptance.       Plan Discussed with:   Anesthesia Plan Comments: (Patient identified. Risks/Benefits/Options discussed with patient including but not limited to bleeding, infection, nerve damage, paralysis, failed block, incomplete pain control, headache, blood pressure changes, nausea, vomiting, reactions to medication both or  allergic, itching and postpartum back pain. Confirmed with bedside nurse the patient's most recent platelet count. Confirmed with patient that they are not currently taking any anticoagulation, have any bleeding history or any family history of bleeding disorders. Patient expressed understanding and wished to proceed. All questions were answered. )       Anesthesia Quick Evaluation

## 2018-07-25 NOTE — H&P (Addendum)
Victoria Mills is a 23 y.o. female, G3P1101, IUP at 40.2 weeks, presenting for Elective IOL. H/O asthma and preE, (Last delivery, induced, no meds pp. Recommend ASA 81 mg daily. induced at 29wks) neg labs 01/30/2018, during this pregnancy, no dx this pregnancy, mss down syndrome risk in 1 in 30 for this pregnancy (INCREASED RISK OF DS ON QUAD SCREEN. 1/30. REFERRED TO MFM FOR ANATOMY US AND AND GENETIC COUNSELING. PT SEEN BY MFM AND GENETIC COUNSELORS COUNSELING. NIPT DONE AND [place] FOR ANATOMYUS FOR GROWTH Q 4 WEEKS START AT 24 WEEKS FETAL ECHO WNL.), h/o familial congential anomaly (Son born at 64 weeks with ? "hypoplastic lungs", died at age 38 months.) Pt endorse + Fm. Denies vaginal leakage. Denies vaginal bleeding. Denies feeling cxt's. Baby female, gbs-.  Patient Active Problem List   Diagnosis Date Noted  . Encounter for elective induction of labor 07/25/2018  . Blurry vision, bilateral 07/01/2018  . Generalized pain 07/01/2018  . 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 Abnormal findings on antenatal screening of mother 02/12/2018     Medications Prior to Admission  Medication Sig Dispense Refill Last Dose  . ALBUTEROL IN Inhale into the lungs.   07/01/2018 at Unknown time  . Budesonide-Formoterol Fumarate (SYMBICORT IN) Inhale into the lungs.   07/01/2018 at Unknown time  . LORATADINE PO Take by mouth.   07/01/2018 at Unknown time  . Prenatal Vit-Fe Fumarate-FA (PRENATAL VITAMIN PO) Take by mouth.   07/01/2018 at Unknown time    Past Medical History:  Diagnosis Date  . Asthma    last inhaler use 07/23/2018  . Environmental allergies   . History of pre-eclampsia in prior pregnancy, currently pregnant   . Preeclampsia 2017     No current facility-administered medications on file prior to encounter.    Current Outpatient Medications on File Prior to Encounter  Medication Sig Dispense Refill  . ALBUTEROL IN Inhale into the lungs.    .  Budesonide-Formoterol Fumarate (SYMBICORT IN) Inhale into the lungs.    Marland Kitchen LORATADINE PO Take by mouth.    . Prenatal Vit-Fe Fumarate-FA (PRENATAL VITAMIN PO) Take by mouth.       No Known Allergies  History of present pregnancy: Pt Info/Preference:  Screening/Consents:  Labs:   EDD: Estimated Date of Delivery: 07/23/18  Establised: Patient's last menstrual period was 10/10/2017.  Anatomy Scan: Date: Declined Placenta Location: fundal Genetic Screen: Panoroma:no AFP: ABN First Tri: Quad: elevated t21 risk 1 in 30  Office: [facility]             First PNV: 12.2 wg Blood Type --/--/O POS, O POS Performed at [facility], [address], [place], [place] [address]  [phone]/17 1300)  Language: english Last PNV: 40 wg Rhogam    Flu Vaccine:  utd   Antibody NEG (04/17 1300)  TDaP vaccine utd   GTT: Early: n/a Third Trimester: 98  Feeding Plan: Breast/bottle BTL: no Rubella: Immune (09/10 0000)  Contraception: ??? VBAC: no RPR: Nonreactive (09/10 0000)   Circumcision: female   HBsAg: Negative (09/10 0000)  Pediatrician:  Dr Lucrezia Starch   HIV: Non-reactive (09/10 0000)   Prenatal Classes: no Additional [place]: Normal fetal echo 04/24/2018 Growth scan see below GBS: Negative (03/23 0000)(For PCN allergy, check sensitivities)       Chlamydia: neg    MFM Referral/Consult:  GC: neg  Support Person: partner   PAP: 2019 normal  Pain Management: epidural Neonatologist Referral:  Hgb  Electrophoresis:  AA  Birth Plan: epidural   Hgb NOB: 11.9    28W: 11  06/27/2018 growth scan:   OB History    Gravida  3   Para  2   Term  1   Preterm  1   AB      Living  1     SAB      TAB      Ectopic      Multiple      Live Births  2        Obstetric Comments  2015- induced at 29 wks for preeclampsia. Baby died at 11 months d/t hypoplastic lung       Past Medical History:  Diagnosis Date  . Asthma    last inhaler use 07/23/2018  . Environmental allergies   . History of pre-eclampsia in prior  pregnancy, currently pregnant   . Preeclampsia 2017   Past Surgical History:  Procedure Laterality Date  . HIP ARTHROSCOPY    . WISDOM TOOTH EXTRACTION     Family History: family history includes Birth defects in her son. Social History:  reports that she has never smoked. She has never used smokeless tobacco. She reports that she does not drink alcohol or use drugs.   Prenatal Transfer Tool  Maternal Diabetes: No Genetic Screening: Abnormal:  Results: Elevated risk of Trisomy 21 Maternal Ultrasounds/Referrals: Normal Fetal Ultrasounds or other Referrals:  Fetal echo normal 04/24/2018 Maternal Substance Abuse:  No Significant Maternal Medications:  None Significant Maternal Lab Results: Lab values include: Group B Strep negative  ROS:  Review of Systems  Constitutional: Negative.   HENT: Negative.   Eyes: Negative.   Respiratory: Negative.   Cardiovascular: Negative.   Gastrointestinal: Negative.   Genitourinary: Negative.   Musculoskeletal: Negative.   Skin: Negative.   Neurological: Negative.   Endo/Heme/Allergies: Negative.   Psychiatric/Behavioral: Negative.      Physical Exam: BP 138/78   Pulse 72   Resp 18   LMP 10/10/2017   Physical Exam  Constitutional: She is oriented to person, place, and time and well-developed, well-nourished, and in no distress.  HENT:  Head: Normocephalic and atraumatic.  Eyes: Pupils are equal, round, and reactive to light. Conjunctivae are normal.  Neck: Normal range of motion. Neck supple.  Cardiovascular: Normal rate, regular rhythm and normal heart sounds.  Pulmonary/Chest: Effort normal and breath sounds normal.  Abdominal: Soft. Bowel sounds are normal.  Genitourinary:    Genitourinary Comments: Pelvis adequate for vaginal delivery, uterus gravida equal to dates.    Neurological: She is alert and oriented to person, place, and time. Gait normal.  Skin: Skin is warm and dry.  Psychiatric: Affect normal.  Nursing note and  vitals reviewed.    NST: FHR baseline 145 bpm, Variability: moderate, Accelerations:present, Decelerations:  Absent= Cat 1/Reactive UC:   irregular, every 1 in 10 minutes SVE:   Dilation: 1.5 Effacement (%): 40 Station: -3 Exam by:: Henderson Newcomer, RN, vertex verified by fetal sutures.  Leopold's: Position vertex, EFW 7-7.5lbs via leopold's.   Labs: Results for orders placed or performed during the hospital encounter of 07/25/18 (from the past 24 hour(s))  CBC     Status: Abnormal   Collection Time: 07/25/18 12:54 PM  Result Value Ref Range   WBC 10.2 4.0 - 10.5 K/uL   RBC 3.85 (L) 3.87 - 5.11 MIL/uL   Hemoglobin 9.5 (L) 12.0 - 15.0 g/dL   HCT 81.1 (L) 91.4 - 78.2 %   MCV  81.6 80.0 - 100.0 fL   MCH 24.7 (L) 26.0 - 34.0 pg   MCHC 30.3 30.0 - 36.0 g/dL   RDW 40.9 81.1 - 91.4 %   Platelets 264 150 - 400 K/uL   nRBC 0.0 0.0 - 0.2 %  Type and screen     Status: None   Collection Time: 07/25/18  1:00 PM  Result Value Ref Range   ABO/RH(D) O POS    Antibody Screen NEG    Sample Expiration      07/28/2018 Performed at [facility], [address] [place], [place] [address]   ABO/Rh     Status: None   Collection Time: 07/25/18  1:00 PM  Result Value Ref Range   ABO/RH(D)      O POS Performed at [facility], [address], [place], [place] [address]     Imaging:  No results found.  MAU Course: Orders Placed This Encounter  Procedures  . CBC  . RPR  . Diet regular Room service appropriate? Yes; Fluid consistency: Thin  . Discontinue Pitocin if tachysystole with non-reassuring FHR is present  . Evaluate fetal heart rate to establish reassuring pattern prior to initiating Cytotec or Pitocin  . If tachysystole WITH reassuring FHR present notify MD / CNM  . Initiate intrauterine resuscitation if tachysystole with non-reasuring FHR is present  . Labor Induction  . May administer Terbutaline 0.25 mg SQ x 1 dose if tachysystole with non-reassuring FHR is  presesnt  . Nofify MD/CNM if tachysystole with non-reassuring FHR is present  . Perform a cervical exam prior to initiating Cytotec or Pitocin  . Activity as tolerated  . Fetal monitoring per unit policy  . Notify Physician  . Vitals signs per unit policy  . Order Rapid HIV per protocol if no results on chart  . Cervical Exam  . Discontinue foley prior to vaginal delivery  . Fundal check post delivery every 15 min x 1 hour then every 30 min x 1 hour  . If Rapid HIV test positive or known HIV positive: initiate AZT orders  . Initiate Carrier Fluid Protocol  . Initiate Oral Care Protocol  . Insert foley catheter  . May in and out cath x 2 for inability to void  . Measure blood pressure post delivery every 15 min x 1 hour then every 30 min x 1 hour  . Patient may have epidural placement upon request  . Full code  . Oxygen therapy  . Type and screen  . ABO/Rh  . Insert and maintain IV Line  . Admit to Inpatient (patient's expected length of stay will be greater than 2 midnights or inpatient only procedure)   Meds ordered this encounter  Medications  . misoprostol (CYTOTEC) tablet 25 mcg  . oxytocin (PITOCIN) IV infusion 40 units in NS 1000 mL - Premix    Order Specific Question:   Begin infusion at:    Answer:   1 milli-unit/min (1.5 mL/hr)    Order Specific Question:   Increase infusion by:    Answer:   1 milli-unit/min (1.5 mL/hr)  . terbutaline (BRETHINE) injection 0.25 mg  . lactated ringers infusion  . lactated ringers infusion 500-1,000 mL  . oxytocin (PITOCIN) IV BOLUS FROM BAG  . oxytocin (PITOCIN) IV infusion 40 units in NS 1000 mL - Premix  . acetaminophen (TYLENOL) tablet 650 mg  . fentaNYL (SUBLIMAZE) injection 50-100 mcg  . lidocaine (PF) (XYLOCAINE) 1 % injection 30 mL  .  ondansetron (ZOFRAN) injection 4 mg  . sodium citrate-citric acid (ORACIT) solution 30 mL  . sodium phosphate (FLEET) 7-19 GM/118ML enema 1 enema    Assessment/Plan: Dahlia BailiffJadon Godbey is a 23 y.o.  female, G3P1101, IUP at 40.2 weeks, presenting for Elective IOL. H/O asthma and preE, (Last delivery, induced, no meds pp. Recommend ASA 81 mg daily. induced at 29wks) neg labs 01/30/2018, during this pregnancy, no dx this pregnancy, mss down syndrome risk in 1 in 30 for this pregnancy (INCREASED RISK OF DS ON QUAD SCREEN. 1/30. REFERRED TO MFM FOR ANATOMY US AND AND GENETIC COUNSELING. PT SEEN BY MFM AND GENETIC COUNSELORS COUNSELING. NIPT DONE AND US FOR ANATOMYUS FOR GROWTH Q 4 WEEKS START AT 24 WEEKS FETAL ECHO WNL.), h/o familial congential anomaly (Son born at 2929 weeks with ? "hypoplastic lungs", died at age 24 months.) Pt endorse + Fm. Denies vaginal leakage. Denies vaginal bleeding. Denies feeling cxt's. Baby female, gbs-.  FWB: Cat 1 Fetal Tracing.   I discussed with patient risks, benefits and alternatives of labor induction including higher risk of cesarean delivery compared to spontaneous labor.  We discussed risks of induction agents including effects on fetal heart beat, contraction pattern and need for close monitoring.  Patient expressed understanding of all this and desired to proceed with the induction. Risks and benefits of induction were reviewed, including failure of method, prolonged labor, need for further intervention, risk of cesarean.  Patient and family verbalized understanding and denies any further questions at this time. Pt and family wish to proceed with induction process. Discussed induction options of Cytotec, foley bulb, AROM, and pitocin were reviewed as well as risks and benefits with use of each discussed.  Plan: Admit to Birthing Suite per consult with Dr Mora ApplPinn Routine [facility] orders Pain med/epidural prn Start with Cytotec for cervical ripening.  Anticipate labor progression   Wellstone Regional HospitalJade Raylan Hanton NP-C, CNM, MSN 07/25/2018, 2:58 PM   Addendum 3:28 PM elevated BP noted Patient Vitals for the past 24 hrs:  BP Pulse Resp  07/25/18 1402 138/78 72 18  07/25/18 1320 (!)  149/87 75 18  with h/o Preeclampsia in previous pregnancy, will draw preE labs for baseline, pt is asymptomatic.

## 2018-07-26 LAB — CBC
HCT: 29.9 % — ABNORMAL LOW (ref 36.0–46.0)
Hemoglobin: 9.3 g/dL — ABNORMAL LOW (ref 12.0–15.0)
MCH: 24.9 pg — ABNORMAL LOW (ref 26.0–34.0)
MCHC: 31.1 g/dL (ref 30.0–36.0)
MCV: 80.2 fL (ref 80.0–100.0)
Platelets: 249 10*3/uL (ref 150–400)
RBC: 3.73 MIL/uL — ABNORMAL LOW (ref 3.87–5.11)
RDW: 13.6 % (ref 11.5–15.5)
WBC: 18.7 10*3/uL — ABNORMAL HIGH (ref 4.0–10.5)
nRBC: 0.2 % (ref 0.0–0.2)

## 2018-07-26 LAB — ABO/RH: ABO/RH(D): O POS

## 2018-07-26 LAB — RPR: RPR Ser Ql: NONREACTIVE

## 2018-07-26 MED ORDER — FERROUS SULFATE 325 (65 FE) MG PO TABS
325.0000 mg | ORAL_TABLET | Freq: Every day | ORAL | Status: DC
  Administered 2018-07-27: 325 mg via ORAL
  Filled 2018-07-26: qty 1

## 2018-07-26 MED ORDER — IBUPROFEN 100 MG/5ML PO SUSP
600.0000 mg | Freq: Four times a day (QID) | ORAL | Status: DC
  Administered 2018-07-26 – 2018-07-27 (×6): 600 mg via ORAL
  Filled 2018-07-26 (×9): qty 30

## 2018-07-26 MED ORDER — ALBUTEROL SULFATE (2.5 MG/3ML) 0.083% IN NEBU: 2.5000 mg | INHALATION_SOLUTION | RESPIRATORY_TRACT | Status: DC | PRN

## 2018-07-26 MED ORDER — ACETAMINOPHEN 160 MG/5ML PO SOLN
650.0000 mg | ORAL | Status: DC | PRN
  Administered 2018-07-26: 650 mg via ORAL
  Filled 2018-07-26: qty 20.3

## 2018-07-26 NOTE — Progress Notes (Signed)
Post Partum Day 1 Subjective: no complaints, up ad lib, voiding and tolerating PO. Asymptomatic for preeclampsia. Mild asymptomatic anemia.   Objective: Vitals:   07/26/18 0105 07/26/18 0409 07/26/18 0809 07/26/18 1253  BP: (!) 147/80 140/90 137/89 138/88  Pulse: 74 (!) 55 63 73  Resp: 18 18 18 18   Temp: 98.4 F (36.9 C) 98.4 F (36.9 C) 98.9 F (37.2 C) 98.5 F (36.9 C)  TempSrc: Axillary Axillary Axillary Oral   Physical Exam:  General: alert and cooperative Lochia: appropriate Uterine Fundus: firm Incision: n/a DVT Evaluation: No evidence of DVT seen on physical exam. Negative Homan's sign. No cords or calf tenderness. No significant calf/ankle edema.  Recent Labs    07/25/18 2222 07/26/18 0501  HGB 9.6* 9.3*  HCT 31.1* 29.9*    Assessment/Plan: Plan for discharge tomorrow and Breastfeeding  PO Iron QD for mild asymptomatic anemia    LOS: 1 day   Janeece Riggers 07/26/2018, 2:49 PM

## 2018-07-26 NOTE — Anesthesia Postprocedure Evaluation (Signed)
Anesthesia Post Note  Patient: Heela Frydman  Procedure(s) Performed: AN AD HOC LABOR EPIDURAL     Patient location during evaluation: Mother Baby Anesthesia Type: Epidural Level of consciousness: awake and alert Pain management: pain level controlled Vital Signs Assessment: post-procedure vital signs reviewed and stable Respiratory status: spontaneous breathing Cardiovascular status: stable Postop Assessment: no headache, patient able to bend at knees, no apparent nausea or vomiting, no backache, epidural receding, adequate PO intake, spinal receding and able to ambulate Anesthetic complications: no    Last Vitals:  Vitals:   07/26/18 0105 07/26/18 0409  BP: (!) 147/80 140/90  Pulse: 74 (!) 55  Resp: 18 18  Temp: 36.9 C 36.9 C    Last Pain:  Vitals:   07/26/18 0809  TempSrc:   PainSc: 0-No pain   Pain Goal:                Epidural/Spinal Function Cutaneous sensation: Normal sensation (07/26/18 0809)  Salome Arnt

## 2018-07-26 NOTE — Lactation Note (Signed)
This note was copied from a baby's chart. Lactation Consultation Note  Patient Name: Victoria Mills [id number] Date: 07/26/2018 Reason for consult: Initial assessment;Term  P2 mother whose infant is now 48 hours old.    Baby was asleep in bassinet when I arrived.  Baby has a bruised face.  Mother is concerned that baby is not "getting enough."  Discussed tummy size, hours of life, milk coming to volume, supply and demand and feeding cues.  Mother also stated that baby will only latch to one breast.  Offered to assist with the next feeding to see if I could help her find a comfortable position for latching and mother accepted.  She will call RN/LC the next time baby is ready to feed.  Encouraged to feed 8-12 times/24 hours or sooner if baby shows feeding cues.  Mother has been taught hand expression.  Colostrum container provided for any EBM she obtains with hand expression.  Milk storage times reviewed and finger feeding demonstrated.  Mom made aware of O/P services, breastfeeding support groups, community resources, and our phone # for post-discharge questions. RN updated.   Maternal Data Formula Feeding for Exclusion: Yes Reason for exclusion: Mother's choice to formula and breast feed on admission Has patient been taught Hand Expression?: Yes  Feeding    LATCH Score                   Interventions    Lactation Tools Discussed/Used     Consult Status Consult Status: Follow-up Date: 07/27/18 Follow-up type: In-patient    [name] R [name] 07/26/2018, 4:03 PM

## 2018-07-26 NOTE — Progress Notes (Signed)
CSW acknowledged consult for infant loss in 2015.  CSW is screening out referral since there is no evidence to support need to address loss at this time.   Please contact CSW by MOB's request, if it is noted that history begins to impact patient care, if there are concerns about bonding.  CSW acknowledged that MOB scored a 6 on the Edinburgh Postnatal Depression Scale.    Shem Plemmons Boyd-Gilyard, MSW, LCSW Clinical Social Work [phone] 

## 2018-07-27 DIAGNOSIS — O139 Gestational [pregnancy-induced] hypertension without significant proteinuria, unspecified trimester: Secondary | ICD-10-CM | POA: Diagnosis not present

## 2018-07-27 DIAGNOSIS — O9081 Anemia of the puerperium: Secondary | ICD-10-CM | POA: Diagnosis not present

## 2018-07-27 MED ORDER — ALBUTEROL SULFATE (2.5 MG/3ML) 0.083% IN NEBU: 2.5000 mg | INHALATION_SOLUTION | RESPIRATORY_TRACT | 12 refills | Status: AC | PRN

## 2018-07-27 MED ORDER — FERROUS SULFATE 325 (65 FE) MG PO TABS: 325.0000 mg | ORAL_TABLET | Freq: Every day | ORAL | 3 refills | Status: DC

## 2018-07-27 MED ORDER — IBUPROFEN 100 MG/5ML PO SUSP: 600.0000 mg | Freq: Four times a day (QID) | ORAL | 1 refills | Status: DC | PRN

## 2018-07-27 NOTE — Lactation Note (Signed)
This note was copied from a baby's chart. Lactation Consultation Note:  Mother had infant latched in cradle hold when I arrived in the room.   Patient Name: Victoria Mills [id number] Date: 07/27/2018 Reason for consult: Follow-up assessment   Maternal Data    Feeding Feeding Type: Breast Fed  LATCH Score                   Interventions    Lactation Tools Discussed/Used     Consult Status      [name] 07/27/2018, 12:55 PM

## 2018-07-27 NOTE — Discharge Summary (Addendum)
SVD OB Discharge Summary     Patient Name: Victoria BailiffJadon Mills DOB: 1995-04-22 MRN: [id number]  Date of admission: 07/25/2018 Delivering MD: Dale DurhamMONTANA, Jaryiah Mehlman  Date of delivery: 07/25/2018 Type of delivery: SVD  Newborn Data: Sex: Baby  female  Circumcision: No Live born female  Birth Weight: 8 lb 4.5 oz (3756 g) APGAR: 8, 9  Newborn Delivery   Birth date/time:  07/25/2018 20:27:00 Delivery type:  Vaginal, Spontaneous     Feeding: breast and bottle Infant being discharge to home with mother in stable condition.   Admitting diagnosis: pregnancy Intrauterine pregnancy: [redacted]w[redacted]d     Secondary diagnosis:  Active Problems:   Encounter for elective induction of labor   Gestational hypertension   Postpartum anemia   Normal postpartum course                                Complications: None                                                              Intrapartum Procedures: spontaneous vaginal delivery Postpartum Procedures: none Complications-Operative and Postpartum: none Augmentation: AROM, Pitocin and Cytotec   History of Present Illness: Ms. Victoria Mills is a 23 y.o. female, [id number], who presents at 416w2d weeks gestation. The patient has been followed at  [facility]  Her pregnancy has been complicated by:  Patient Active Problem List   Diagnosis Date Noted  . Gestational hypertension 07/27/2018  . Postpartum anemia 07/27/2018  . Normal postpartum course 07/27/2018  . Encounter for elective induction of labor 07/25/2018  . Blurry vision, bilateral 07/01/2018  . Generalized pain 07/01/2018  . 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 Abnormal findings on antenatal screening of mother 02/12/2018    Hospital course:  Induction of Labor With Vaginal Delivery   23 y.o. yo [id number] at 266w2d was admitted to the hospital 07/25/2018 for induction of labor.  Indication for induction: Elective.  Patient  had an uncomplicated labor course as follows: Membrane Rupture Time/Date: 6:10 PM ,07/25/2018   Intrapartum Procedures: Episiotomy: None [1]                                         Lacerations:  None [1]  Patient had delivery of a Viable infant.  Information for the patient's newborn:  Victoria Mills, Girl Marlea [[id number]][[id number]]  Delivery Method: Vag-Spont   07/25/2018  Details of delivery can be found in separate delivery note.  Patient had a routine postpartum course. Patient is discharged home 07/27/18. Postpartum Day # 2 : S/P NSVD due to IOL for elective . Patient up ad lib, denies syncope or dizziness. Reports consuming regular diet without issues and denies N/V. Patient reports 0 bowel movement + passing flatus.  Denies issues with urination and reports bleeding is "lighter."  Patient is breast and bottle feeding and reports going well.  Desires depo before leaving hospital  for postpartum contraception.  Pain is being appropriately managed with use of po meds. Asymptomatic anemia. Denies HA, RUQ pain or vision changes. GHTN but BP 136/87.  Physical exam  Vitals:   07/26/18 0809 07/26/18 1253 07/26/18 2151 07/27/18 0624  BP: 137/89 138/88 133/75 136/87  Pulse: 63 73 (!) 57 (!) 56  Resp: Temp: 98.9 F (37.2 C) 98.5 F (36.9 C) 98.3 F (36.8 C) 97.7 F (36.5 C)  TempSrc: Axillary Oral  Oral  SpO2:   98% 99%   General: alert, cooperative and no distress Lochia: appropriate Uterine Fundus: firm Perineum: Intact DVT Evaluation: No evidence of DVT seen on physical exam. Negative Homan's sign. No cords or calf tenderness. No significant calf/ankle edema.  Labs: Lab Results  Component Value Date   WBC 18.7 (H) 07/26/2018   HGB 9.3 (L) 07/26/2018   HCT 29.9 (L) 07/26/2018   MCV 80.2 07/26/2018   PLT 249 07/26/2018   CMP Latest Ref Rng & Units 07/25/2018  Glucose 70 - 99 mg/dL 72  BUN 6 - 20 mg/dL <1(O)  Creatinine 1.09 - 1.00 mg/dL 6.04  Sodium 540 - 981 mmol/L 136   Potassium 3.5 - 5.1 mmol/L 3.9  Chloride 98 - 111 mmol/L 103  CO2 22 - 32 mmol/L 22  Calcium 8.9 - 10.3 mg/dL 9.3  Total Protein 6.5 - 8.1 g/dL 6.0(L)  Total Bilirubin 0.3 - 1.2 mg/dL 0.8  Alkaline Phos 38 - 126 U/L 158(H)  AST 15 - 41 U/L 21  ALT 0 - 44 U/L 8    Date of discharge: 07/27/2018 Discharge Diagnoses: Term Pregnancy-delivered Discharge instruction: per After Visit Summary and "Baby and Me Booklet".  After visit meds:  Allergies as of 07/27/2018   No Known Allergies     Medication List    TAKE these medications   albuterol (2.5 MG/3ML) 0.083% nebulizer solution Commonly known as:  PROVENTIL Inhale 3 mLs (2.5 mg total) into the lungs every 4 (four) hours as needed for wheezing or shortness of breath.   ALBUTEROL IN Inhale into the lungs.   ferrous sulfate 325 (65 FE) MG tablet Take 1 tablet (325 mg total) by mouth daily with breakfast. Start taking on:  July 28, 2018   ibuprofen 100 MG/5ML suspension Commonly known as:  ADVIL Take 30 mLs (600 mg total) by mouth every 6 (six) hours as needed for up to 30 doses for fever, mild pain or moderate pain.   LORATADINE PO Take by mouth.   PRENATAL VITAMIN PO Take by mouth.   SYMBICORT IN Inhale into the lungs.       Activity:           unrestricted and pelvic rest Advance as tolerated. Pelvic rest for 6 weeks.  Diet:                routine Medications: PNV, Ibuprofen, Colace and Iron Postpartum contraception: Depo Provera Condition:  Pt discharge to home with baby in stable Anemia: PO iron TID GHTN: monitor BP, check BP at home if >150/90s please call us. If HA RUQ pain or vision changes call us.   Meds: Allergies as of 07/27/2018   No Known Allergies     Medication List    TAKE these medications   albuterol (2.5 MG/3ML) 0.083% nebulizer solution Commonly known as:  PROVENTIL Inhale 3 mLs (2.5 mg total) into the lungs every 4 (four) hours as needed for wheezing or shortness of breath.   ALBUTEROL  IN Inhale into the lungs.   ferrous sulfate 325 (65 FE) MG tablet Take 1 tablet (325 mg total) by mouth daily with breakfast. Start taking on:  July 28, 2018   ibuprofen 100 MG/5ML suspension Commonly known as:  ADVIL Take 30 mLs (600 mg total) by mouth every 6 (six) hours as needed for up to 30 doses for fever, mild pain or moderate pain.   LORATADINE PO Take by mouth.   PRENATAL VITAMIN PO Take by mouth.   SYMBICORT IN Inhale into the lungs.       Discharge Follow Up:  Follow-up Information    [facility]. Schedule an appointment as soon as possible for a visit in 6 week(s).   Specialty:  Obstetrics and Gynecology Why:  Follow up for 6 week postpartum check.  Contact information: [address] [place] [address] [phone]           [place], NP-C, CNM 07/27/2018, 4:28 PM  [place], FNP

## 2019-07-09 ENCOUNTER — Other Ambulatory Visit: Payer: Self-pay

## 2019-07-09 ENCOUNTER — Emergency Department (HOSPITAL_COMMUNITY)
Admission: EM | Admit: 2019-07-09 | Discharge: 2019-07-10 | Disposition: A | Discharge status: Home / Self Care | Payer: BC Managed Care – PPO | Attending: Emergency Medicine | Admitting: Emergency Medicine

## 2019-07-09 ENCOUNTER — Encounter (HOSPITAL_COMMUNITY): Payer: Self-pay

## 2019-07-09 DIAGNOSIS — J4541 Moderate persistent asthma with (acute) exacerbation: Secondary | ICD-10-CM | POA: Insufficient documentation

## 2019-07-09 DIAGNOSIS — Z79899 Other long term (current) drug therapy: Secondary | ICD-10-CM | POA: Diagnosis not present

## 2019-07-09 DIAGNOSIS — R0602 Shortness of breath: Secondary | ICD-10-CM | POA: Diagnosis present

## 2019-07-09 NOTE — ED Triage Notes (Signed)
Patient arrived stating she is out of both her daily inhaler and rescue. States she has been short of breath today. Labored breathing in triage.

## 2019-07-10 DIAGNOSIS — J4541 Moderate persistent asthma with (acute) exacerbation: Secondary | ICD-10-CM | POA: Diagnosis not present

## 2019-07-10 MED ORDER — ALBUTEROL SULFATE HFA 108 (90 BASE) MCG/ACT IN AERS
8.0000 | INHALATION_SPRAY | Freq: Once | RESPIRATORY_TRACT | Status: AC
  Administered 2019-07-10: 8 via RESPIRATORY_TRACT
  Filled 2019-07-10: qty 6.7

## 2019-07-10 MED ORDER — PREDNISONE 20 MG PO TABS
60.0000 mg | ORAL_TABLET | Freq: Once | ORAL | Status: AC
  Administered 2019-07-10: 60 mg via ORAL
  Filled 2019-07-10: qty 3

## 2019-07-10 MED ORDER — PREDNISONE 20 MG PO TABS: ORAL_TABLET | ORAL | 0 refills | Status: DC

## 2019-07-10 MED ORDER — BUDESONIDE-FORMOTEROL FUMARATE 160-4.5 MCG/ACT IN AERO: 2.0000 | INHALATION_SPRAY | Freq: Two times a day (BID) | RESPIRATORY_TRACT | 0 refills | Status: AC

## 2019-07-10 MED ORDER — IPRATROPIUM BROMIDE HFA 17 MCG/ACT IN AERS
2.0000 | INHALATION_SPRAY | Freq: Once | RESPIRATORY_TRACT | Status: AC
  Administered 2019-07-10: 2 via RESPIRATORY_TRACT
  Filled 2019-07-10: qty 12.9

## 2019-07-10 NOTE — Discharge Instructions (Signed)
Use your inhaler every 4 hours(6 puffs) while awake, return for sudden worsening shortness of breath, or if you need to use your inhaler more often.  ° °

## 2019-07-10 NOTE — ED Provider Notes (Signed)
[facility]-EMERGENCY DEPT Provider Note   CSN: [id number] Arrival date & time: 07/09/19  2334     History Chief Complaint  Patient presents with  . Asthma    Victoria Mills is a 24 y.o. female.  24 yo F with a chief complaints of shortness of breath and cough.  Going on for the past couple days.  Has run out of her home inhalers, has Symbicort and albuterol.  Patient is from [place] and is not yet established with a family doctor.  Denies fever denies nausea or vomiting.  The history is provided by the patient.  Asthma This is a recurrent problem. The current episode started 2 days ago. The problem occurs constantly. The problem has not changed since onset.Associated symptoms include shortness of breath. Pertinent negatives include no chest pain, no abdominal pain and no headaches. Nothing aggravates the symptoms. Nothing relieves the symptoms. She has tried nothing for the symptoms. The treatment provided no relief.       Past Medical History:  Diagnosis Date  . Asthma    last inhaler use 07/23/2018  . Environmental allergies   . History of pre-eclampsia in prior pregnancy, currently pregnant   . Preeclampsia 2017    Patient Active Problem List   Diagnosis Date Noted  . Gestational hypertension 07/27/2018  . Postpartum anemia 07/27/2018  . Normal postpartum course 07/27/2018  . Encounter for elective induction of labor 07/25/2018  . Blurry vision, bilateral 07/01/2018  . Generalized pain 07/01/2018  . 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 Abnormal findings on antenatal screening of mother 02/12/2018    Past Surgical History:  Procedure Laterality Date  . HIP ARTHROSCOPY    . WISDOM TOOTH EXTRACTION       OB History    Gravida  3   Para  3   Term  2   Preterm  1   AB      Living  2     SAB      TAB      Ectopic      Multiple  0   Live Births  3        Obstetric Comments   2015- induced at 29 wks for preeclampsia. 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 Birth defects Son        hypoplastic lungs    Social History   Tobacco Use  . Smoking status: Never Smoker  . Smokeless tobacco: Never Used  Substance Use Topics  . Alcohol use: No  . Drug use: No    Home Medications Prior to Admission medications   Medication Sig Start Date End Date Taking? Authorizing Provider  albuterol (PROVENTIL) (2.5 MG/3ML) 0.083% nebulizer solution Inhale 3 mLs (2.5 mg total) into the lungs every 4 (four) hours as needed for wheezing or shortness of breath. 07/27/18   Hoover Browns, MD  ALBUTEROL IN Inhale into the lungs.    [provider]  budesonide-formoterol (SYMBICORT) 160-4.5 MCG/ACT inhaler Inhale 2 puffs into the lungs 2 (two) times daily. 07/10/19   Melene Plan, DO  ferrous sulfate 325 (65 FE) MG tablet Take 1 tablet (325 mg total) by mouth daily with breakfast. 07/28/18 09/08/18  Hoover Browns, MD  ibuprofen (ADVIL) 100 MG/5ML suspension Take 30 mLs (600 mg total) by mouth every 6 (six) hours as needed for up to  30 doses for fever, mild pain or moderate pain. 07/27/18   Waymon Amato, MD  LORATADINE PO Take by mouth.    [provider]  predniSONE (DELTASONE) 20 MG tablet 2 tabs po daily x 4 days 07/10/19   Deno Etienne, DO  Prenatal Vit-Fe Fumarate-FA (PRENATAL VITAMIN PO) Take by mouth.    [provider]    Allergies    Patient has no known allergies.  Review of Systems   Review of Systems  Constitutional: Negative for chills and fever.  HENT: Negative for congestion and rhinorrhea.   Eyes: Negative for redness and visual disturbance.  Respiratory: Positive for cough and shortness of breath. Negative for wheezing.   Cardiovascular: Negative for chest pain and palpitations.  Gastrointestinal: Negative for abdominal pain, nausea and vomiting.  Genitourinary: Negative for dysuria and  urgency.  Musculoskeletal: Negative for arthralgias and myalgias.  Skin: Negative for pallor and wound.  Neurological: Negative for dizziness and headaches.    Physical Exam Updated Vital Signs BP (!) 168/79 (BP Location: Left Arm)   Pulse 80   Temp 97.9 F (36.6 C) (Oral)   Resp (!) 24   SpO2 96%   Physical Exam Vitals and nursing note reviewed.  Constitutional:      General: She is not in acute distress.    Appearance: She is well-developed. She is not diaphoretic.  HENT:     Head: Normocephalic and atraumatic.  Eyes:     Pupils: Pupils are equal, round, and reactive to light.  Cardiovascular:     Rate and Rhythm: Normal rate and regular rhythm.     Heart sounds: No murmur. No friction rub. No gallop.   Pulmonary:     Effort: Pulmonary effort is normal.     Breath sounds: No wheezing or rales.     Comments: Diminished breath sounds in all fields. Tachypnea Abdominal:     General: There is no distension.     Palpations: Abdomen is soft.     Tenderness: There is no abdominal tenderness.  Musculoskeletal:        General: No tenderness.     Cervical back: Normal range of motion and neck supple.  Skin:    General: Skin is warm and dry.  Neurological:     Mental Status: She is alert and oriented to person, place, and time.  Psychiatric:        Behavior: Behavior normal.     ED Results / Procedures / Treatments   Labs (all labs ordered are listed, but only abnormal results are displayed) Labs Reviewed - No data to display  EKG None  Radiology No results found.  Procedures Procedures (including critical care time)  Medications Ordered in ED Medications  albuterol (VENTOLIN HFA) 108 (90 Base) MCG/ACT inhaler 8 puff (8 puffs Inhalation Given 07/10/19 0013)  ipratropium (ATROVENT HFA) inhaler 2 puff (2 puffs Inhalation Given 07/10/19 0017)  predniSONE (DELTASONE) tablet 60 mg (60 mg Oral Given 07/10/19 0011)    ED Course  I have reviewed the triage vital signs  and the nursing notes.  Pertinent labs & imaging results that were available during my care of the patient were reviewed by me and considered in my medical decision making (see chart for details).    MDM Rules/Calculators/A&P                      24 yo F with a chief complaints of shortness of breath.  Going on for couple days.  Inhalers.  Will give a puffs of albuterol prednisone and atrovent.  Reassess.  Feeling much better on reassessment much better aeration.  D/c home.  PCP follow up.   12:43 AM:  I have discussed the diagnosis/risks/treatment options with the patient and believe the pt to be eligible for discharge home to follow-up with PCP. We also discussed returning to the ED immediately if new or worsening sx occur. We discussed the sx which are most concerning (e.g., sudden worsening sob, need to use inhaler more often than every 4 hours.) that necessitate immediate return. Medications administered to the patient during their visit and any new prescriptions provided to the patient are listed below.  Medications given during this visit Medications  albuterol (VENTOLIN HFA) 108 (90 Base) MCG/ACT inhaler 8 puff (8 puffs Inhalation Given 07/10/19 0013)  ipratropium (ATROVENT HFA) inhaler 2 puff (2 puffs Inhalation Given 07/10/19 0017)  predniSONE (DELTASONE) tablet 60 mg (60 mg Oral Given 07/10/19 0011)     The patient appears reasonably screen and/or stabilized for discharge and I doubt any other medical condition or other [facility] requiring further screening, evaluation, or treatment in the ED at this time prior to discharge.   Final Clinical Impression(s) / ED Diagnoses Final diagnoses:  Moderate persistent asthma with exacerbation    Rx / DC Orders ED Discharge Orders         Ordered    budesonide-formoterol (SYMBICORT) 160-4.5 MCG/ACT inhaler  2 times daily     07/10/19 0018    predniSONE (DELTASONE) 20 MG tablet     07/10/19 0042           Melene Plan, DO 07/10/19 [phone]

## 2019-07-10 NOTE — ED Notes (Signed)
Pt sitting up in bed. NAD noted. Pt denies any needs. Will continue to monitor.  

## 2019-12-23 ENCOUNTER — Ambulatory Visit (INDEPENDENT_AMBULATORY_CARE_PROVIDER_SITE_OTHER): Payer: Medicaid Other

## 2019-12-23 ENCOUNTER — Other Ambulatory Visit: Payer: Self-pay

## 2019-12-23 VITALS — BP 132/68 | HR 91 | Ht 63.0 in | Wt 159.6 lb

## 2019-12-23 DIAGNOSIS — Z3491 Encounter for supervision of normal pregnancy, unspecified, first trimester: Secondary | ICD-10-CM

## 2019-12-23 DIAGNOSIS — Z3481 Encounter for supervision of other normal pregnancy, first trimester: Secondary | ICD-10-CM | POA: Insufficient documentation

## 2019-12-23 DIAGNOSIS — N912 Amenorrhea, unspecified: Secondary | ICD-10-CM | POA: Diagnosis not present

## 2019-12-23 DIAGNOSIS — O0990 Supervision of high risk pregnancy, unspecified, unspecified trimester: Secondary | ICD-10-CM | POA: Insufficient documentation

## 2019-12-23 LAB — POCT URINE PREGNANCY: Preg Test, Ur: POSITIVE — AB

## 2019-12-23 MED ORDER — BLOOD PRESSURE KIT DEVI: 1.0000 | 0 refills | Status: DC

## 2019-12-23 NOTE — Progress Notes (Signed)
PRENATAL INTAKE SUMMARY  Ms. Solana presents today New OB Nurse Interview.  OB History    Gravida  4   Para  3   Term  2   Preterm  1   AB      Living  2     SAB      TAB      Ectopic      Multiple  0   Live Births  3        Obstetric Comments  2015- induced at 29 wks for preeclampsia. Baby died at 47 months d/t hypoplastic lung       I have reviewed the patient's medical, obstetrical, social, and family histories, medications, and available lab results.  SUBJECTIVE She has no unusual complaints  OBJECTIVE Initial Physical Exam (New OB)  GENERAL APPEARANCE: alert, well appearing   ASSESSMENT Normal pregnancy  PLAN Prenatal care to be completed at [facility] labs to be completed at NEW OB Provider visit Baby Scripts ordered Blood pressure kit sent to [place] Patient to return in about 4 weeks for U/S only.

## 2019-12-23 NOTE — Progress Notes (Signed)
Victoria Mills presents today for UPT. She has no unusual complaints. LMP: 12/14/19    OBJECTIVE: Appears well, in no apparent distress.  OB History    Gravida  3   Para  3   Term  2   Preterm  1   AB      Living  2     SAB      TAB      Ectopic      Multiple  0   Live Births  3        Obstetric Comments  2015- induced at 29 wks for preeclampsia. Baby died at 26 months d/t hypoplastic lung       Home UPT Result:positive In-Office UPT result: positive I have reviewed the patient's medical, obstetrical, social, and family histories, and medications.   ASSESSMENT: Positive pregnancy test  PLAN Prenatal care to be completed at:  [facility]

## 2019-12-24 NOTE — Progress Notes (Signed)
I have reviewed this chart and agree with the RN/CMA assessment and management.    K. Meryl Abigaile Rossie, M.D. Attending [facility] (Faculty Practice)   

## 2020-01-07 ENCOUNTER — Encounter: Payer: Self-pay | Admitting: Emergency Medicine

## 2020-01-07 ENCOUNTER — Inpatient Hospital Stay (HOSPITAL_COMMUNITY): Payer: Medicaid Other

## 2020-01-07 ENCOUNTER — Encounter (HOSPITAL_COMMUNITY): Payer: Self-pay | Admitting: Obstetrics and Gynecology

## 2020-01-07 ENCOUNTER — Inpatient Hospital Stay (HOSPITAL_COMMUNITY)
Admission: AD | Admit: 2020-01-07 | Discharge: 2020-01-07 | Disposition: A | Discharge status: Home / Self Care | Payer: Medicaid Other | Attending: Obstetrics and Gynecology | Admitting: Obstetrics and Gynecology

## 2020-01-07 ENCOUNTER — Other Ambulatory Visit: Payer: Self-pay

## 2020-01-07 ENCOUNTER — Ambulatory Visit
Admission: EM | Admit: 2020-01-07 | Discharge: 2020-01-07 | Disposition: A | Discharge status: Home / Self Care | Payer: Medicaid Other

## 2020-01-07 DIAGNOSIS — Z3A01 Less than 8 weeks gestation of pregnancy: Secondary | ICD-10-CM | POA: Insufficient documentation

## 2020-01-07 DIAGNOSIS — O26891 Other specified pregnancy related conditions, first trimester: Secondary | ICD-10-CM | POA: Diagnosis not present

## 2020-01-07 DIAGNOSIS — R10814 Left lower quadrant abdominal tenderness: Secondary | ICD-10-CM

## 2020-01-07 DIAGNOSIS — R102 Pelvic and perineal pain: Secondary | ICD-10-CM | POA: Diagnosis not present

## 2020-01-07 DIAGNOSIS — Y92410 Unspecified street and highway as the place of occurrence of the external cause: Secondary | ICD-10-CM | POA: Insufficient documentation

## 2020-01-07 DIAGNOSIS — M25571 Pain in right ankle and joints of right foot: Secondary | ICD-10-CM | POA: Diagnosis not present

## 2020-01-07 DIAGNOSIS — O26899 Other specified pregnancy related conditions, unspecified trimester: Secondary | ICD-10-CM

## 2020-01-07 DIAGNOSIS — R109 Unspecified abdominal pain: Secondary | ICD-10-CM

## 2020-01-07 DIAGNOSIS — O99891 Other specified diseases and conditions complicating pregnancy: Secondary | ICD-10-CM

## 2020-01-07 DIAGNOSIS — R1030 Lower abdominal pain, unspecified: Secondary | ICD-10-CM

## 2020-01-07 DIAGNOSIS — R10813 Right lower quadrant abdominal tenderness: Secondary | ICD-10-CM

## 2020-01-07 DIAGNOSIS — Z3490 Encounter for supervision of normal pregnancy, unspecified, unspecified trimester: Secondary | ICD-10-CM

## 2020-01-07 DIAGNOSIS — Z3491 Encounter for supervision of normal pregnancy, unspecified, first trimester: Secondary | ICD-10-CM

## 2020-01-07 LAB — URINALYSIS, ROUTINE W REFLEX MICROSCOPIC
Bilirubin Urine: NEGATIVE
Glucose, UA: NEGATIVE mg/dL
Hgb urine dipstick: NEGATIVE
Ketones, ur: NEGATIVE mg/dL
Leukocytes,Ua: NEGATIVE
Nitrite: NEGATIVE
Protein, ur: NEGATIVE mg/dL
Specific Gravity, Urine: 1.024 (ref 1.005–1.030)
pH: 6 (ref 5.0–8.0)

## 2020-01-07 LAB — CBC
HCT: 36.3 % (ref 36.0–46.0)
Hemoglobin: 11.7 g/dL — ABNORMAL LOW (ref 12.0–15.0)
MCH: 28.5 pg (ref 26.0–34.0)
MCHC: 32.2 g/dL (ref 30.0–36.0)
MCV: 88.5 fL (ref 80.0–100.0)
Platelets: 267 10*3/uL (ref 150–400)
RBC: 4.1 MIL/uL (ref 3.87–5.11)
RDW: 11.9 % (ref 11.5–15.5)
WBC: 8.9 10*3/uL (ref 4.0–10.5)
nRBC: 0 % (ref 0.0–0.2)

## 2020-01-07 LAB — HCG, QUANTITATIVE, PREGNANCY: hCG, Beta Chain, Quant, S: 58532 m[IU]/mL — ABNORMAL HIGH (ref <5)
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
## 2020-01-27 ENCOUNTER — Encounter: Payer: Medicaid Other | Admitting: Obstetrics and Gynecology

## 2020-01-27 DIAGNOSIS — Z3491 Encounter for supervision of normal pregnancy, unspecified, first trimester: Secondary | ICD-10-CM

## 2020-02-11 ENCOUNTER — Ambulatory Visit (INDEPENDENT_AMBULATORY_CARE_PROVIDER_SITE_OTHER): Payer: Medicaid Other | Admitting: Obstetrics and Gynecology

## 2020-02-11 ENCOUNTER — Other Ambulatory Visit (HOSPITAL_COMMUNITY)
Admission: RE | Admit: 2020-02-11 | Discharge: 2020-02-11 | Disposition: A | Discharge status: Home / Self Care | Payer: Medicaid Other | Source: Ambulatory Visit | Attending: Obstetrics and Gynecology | Admitting: Obstetrics and Gynecology

## 2020-02-11 ENCOUNTER — Other Ambulatory Visit: Payer: Self-pay

## 2020-02-11 ENCOUNTER — Encounter: Payer: Self-pay | Admitting: Obstetrics and Gynecology

## 2020-02-11 VITALS — BP 120/72 | HR 73 | Wt 156.0 lb

## 2020-02-11 DIAGNOSIS — O09299 Supervision of pregnancy with other poor reproductive or obstetric history, unspecified trimester: Secondary | ICD-10-CM | POA: Insufficient documentation

## 2020-02-11 DIAGNOSIS — O99511 Diseases of the respiratory system complicating pregnancy, first trimester: Secondary | ICD-10-CM

## 2020-02-11 DIAGNOSIS — Z3481 Encounter for supervision of other normal pregnancy, first trimester: Secondary | ICD-10-CM | POA: Insufficient documentation

## 2020-02-11 DIAGNOSIS — Z3A12 12 weeks gestation of pregnancy: Secondary | ICD-10-CM

## 2020-02-11 DIAGNOSIS — Z3480 Encounter for supervision of other normal pregnancy, unspecified trimester: Secondary | ICD-10-CM

## 2020-02-11 DIAGNOSIS — J45909 Unspecified asthma, uncomplicated: Secondary | ICD-10-CM | POA: Insufficient documentation

## 2020-02-11 DIAGNOSIS — O99519 Diseases of the respiratory system complicating pregnancy, unspecified trimester: Secondary | ICD-10-CM | POA: Insufficient documentation

## 2020-02-11 MED ORDER — ASPIRIN 81 MG PO CHEW: 81.0000 mg | CHEWABLE_TABLET | Freq: Every day | ORAL | 8 refills | Status: DC

## 2020-02-11 NOTE — Progress Notes (Signed)
NOB in office, intake interview and U/S completed on 12-23-19.

## 2020-02-11 NOTE — Patient Instructions (Signed)
First Trimester of Pregnancy  The first trimester of pregnancy is from week 1 until the end of week 13 (months 1 through 3). During this time, your baby will begin to develop inside you. At 6-8 weeks, the eyes and face are formed, and the heartbeat can be seen on ultrasound. At the end of 12 weeks, all the baby's organs are formed. Prenatal care is all the medical care you receive before the birth of your baby. Make sure you get good prenatal care and follow all of your doctor's instructions. Follow these instructions at home: Medicines  Take over-the-counter and prescription medicines only as told by your doctor. Some medicines are safe and some medicines are not safe during pregnancy.  Take a prenatal vitamin that contains at least 600 micrograms (mcg) of folic acid.  If you have trouble pooping (constipation), take medicine that will make your stool soft (stool softener) if your doctor approves. Eating and drinking   Eat regular, healthy meals.  Your doctor will tell you the amount of weight gain that is right for you.  Avoid raw meat and uncooked cheese.  If you feel sick to your stomach (nauseous) or throw up (vomit): ? Eat 4 or 5 small meals a day instead of 3 large meals. ? Try eating a few soda crackers. ? Drink liquids between meals instead of during meals.  To prevent constipation: ? Eat foods that are high in fiber, like fresh fruits and vegetables, whole grains, and beans. ? Drink enough fluids to keep your pee (urine) clear or pale yellow. Activity  Exercise only as told by your doctor. Stop exercising if you have cramps or pain in your lower belly (abdomen) or low back.  Do not exercise if it is too hot, too humid, or if you are in a place of great height (high altitude).  Try to avoid standing for long periods of time. Move your legs often if you must stand in one place for a long time.  Avoid heavy lifting.  Wear low-heeled shoes. Sit and stand up  straight.  You can have sex unless your doctor tells you not to. Relieving pain and discomfort  Wear a good support bra if your breasts are sore.  Take warm water baths (sitz baths) to soothe pain or discomfort caused by hemorrhoids. Use hemorrhoid cream if your doctor says it is okay.  Rest with your legs raised if you have leg cramps or low back pain.  If you have puffy, bulging veins (varicose veins) in your legs: ? Wear support hose or compression stockings as told by your doctor. ? Raise (elevate) your feet for 15 minutes, 3-4 times a day. ? Limit salt in your food. Prenatal care  Schedule your prenatal visits by the twelfth week of pregnancy.  Write down your questions. Take them to your prenatal visits.  Keep all your prenatal visits as told by your doctor. This is important. Safety  Wear your seat belt at all times when driving.  Make a list of emergency phone numbers. The list should include numbers for family, friends, the hospital, and police and fire departments. General instructions  Ask your doctor for a referral to a local prenatal class. Begin classes no later than at the start of month 6 of your pregnancy.  Ask for help if you need counseling or if you need help with nutrition. Your doctor can give you advice or tell you where to go for help.  Do not use hot tubs, steam   rooms, or saunas.  Do not douche or use tampons or scented sanitary pads.  Do not cross your legs for long periods of time.  Avoid all herbs and alcohol. Avoid drugs that are not approved by your doctor.  Do not use any tobacco products, including cigarettes, chewing tobacco, and electronic cigarettes. If you need help quitting, ask your doctor. You may get counseling or other support to help you quit.  Avoid cat litter boxes and soil used by cats. These carry germs that can cause birth defects in the baby and can cause a loss of your baby (miscarriage) or stillbirth.  Visit your dentist.  At home, brush your teeth with a soft toothbrush. Be gentle when you floss. Contact a doctor if:  You are dizzy.  You have mild cramps or pressure in your lower belly.  You have a nagging pain in your belly area.  You continue to feel sick to your stomach, you throw up, or you have watery poop (diarrhea).  You have a bad smelling fluid coming from your vagina.  You have pain when you pee (urinate).  You have increased puffiness (swelling) in your face, hands, legs, or ankles. Get help right away if:  You have a fever.  You are leaking fluid from your vagina.  You have spotting or bleeding from your vagina.  You have very bad belly cramping or pain.  You gain or lose weight rapidly.  You throw up blood. It may look like coffee grounds.  You are around people who have German measles, fifth disease, or chickenpox.  You have a very bad headache.  You have shortness of breath.  You have any kind of trauma, such as from a fall or a car accident. Summary  The first trimester of pregnancy is from week 1 until the end of week 13 (months 1 through 3).  To take care of yourself and your unborn baby, you will need to eat healthy meals, take medicines only if your doctor tells you to do so, and do activities that are safe for you and your baby.  Keep all follow-up visits as told by your doctor. This is important as your doctor will have to ensure that your baby is healthy and growing well. This information is not intended to replace advice given to you by your health care provider. Make sure you discuss any questions you have with your health care provider. Document Revised: 07/17/2018 Document Reviewed: 04/03/2016 Elsevier Patient Education  2020 Else[employer] Inc.  

## 2020-02-11 NOTE — Progress Notes (Signed)
INITIAL PRENATAL VISIT NOTE  Subjective:  Victoria Mills is a 24 y.o. [phone] at 70w6dby sure LMP being seen today for her initial prenatal visit. This is a unplanned pregnancy.  She was using nothing for birth control previously. She has an obstetric history significant for gestational hypertension/ preeclampsia. She has a medical history significant for asthma.  Patient reports no complaints.  Contractions: Not present. Vag. Bleeding: None.   . Denies leaking of fluid.    Past Medical History:  Diagnosis Date  . Asthma    last inhaler use 07/23/2018  . Environmental allergies   . History of pre-eclampsia in prior pregnancy, currently pregnant   . Preeclampsia 2017    Past Surgical History:  Procedure Laterality Date  . HIP ARTHROSCOPY    . WISDOM TOOTH EXTRACTION      OB History  Gravida Para Term Preterm AB Living  _0 SAB TAB Ectopic Multiple Live Births        0 3    # Outcome Date GA Lbr Len/2nd Weight Sex Delivery Anes PTL Lv  4 Current           3 Term 07/25/18 450w2d07:01 / 00:26 8 lb 4.5 oz (3.756 kg) F Vag-Spont EPI  LIV     Birth Comments: WNL  2 Term 2017    F Vag-Spont   LIV     Complications: Preeclampsia  1 Preterm 2015 [id number] Vag-Spont  N DEC     Complications: Preeclampsia    Obstetric Comments  2015- induced at 29 wks for preeclampsia. Baby died at 11 17 monthst hypoplastic lung    Social History   Socioeconomic History  . Marital status: Single    Spouse name: Not on file  . Number of children: Not on file  . Years of education: Not on file  . Highest education level: Not on file  Occupational History  . Not on file  Tobacco Use  . Smoking status: Never Smoker  . Smokeless tobacco: Never Used  Vaping Use  . Vaping Use: Never used  Substance and Sexual Activity  . Alcohol use: Not Currently    Comment: last drink 12/11/19  . Drug use: No  . Sexual activity: Yes    Partners: Male    Birth control/protection: None  Other  Topics Concern  . Not on file  Social History Narrative  . Not on file   Social Determinants of Health   Financial Resource Strain:   . Difficulty of Paying Living Expenses: Not on file  Food Insecurity:   . Worried About RunCharity fundraiser the Last Year: Not on file  . Ran Out of Food in the Last Year: Not on file  Transportation Needs:   . Lack of Transportation (Medical): Not on file  . Lack of Transportation (Non-Medical): Not on file  Physical Activity:   . Days of Exercise per Week: Not on file  . Minutes of Exercise per Session: Not on file  Stress:   . Feeling of Stress : Not on file  Social Connections:   . Frequency of Communication with Friends and Family: Not on file  . Frequency of Social Gatherings with Friends and Family: Not on file  . Attends Religious Services: Not on file  . Active Member of Clubs or Organizations: Not on file  . Attends CluArchivistetings: Not on file  . Marital Status: Not on file  Family History  Problem Relation Age of Onset  . Birth defects Son        hypoplastic lungs     Current Outpatient Medications:  .  albuterol (PROVENTIL) (2.5 MG/3ML) 0.083% nebulizer solution, Inhale 3 mLs (2.5 mg total) into the lungs every 4 (four) hours as needed for wheezing or shortness of breath., Disp: 75 mL, Rfl: 12 .  Blood Pressure Monitoring (BLOOD PRESSURE KIT) DEVI, 1 kit by Does not apply route once a week., Disp: 1 each, Rfl: 0 .  budesonide-formoterol (SYMBICORT) 160-4.5 MCG/ACT inhaler, Inhale 2 puffs into the lungs 2 (two) times daily., Disp: 1 Inhaler, Rfl: 0 .  Prenatal Vit-Fe Fumarate-FA (PRENATAL MULTIVITAMIN) TABS tablet, Take 1 tablet by mouth daily at 12 noon., Disp: , Rfl:  .  aspirin 81 MG chewable tablet, Chew 1 tablet (81 mg total) by mouth daily., Disp: 30 tablet, Rfl: 8  No Known Allergies  Review of Systems: Negative except for what is mentioned in HPI.  Objective:   Vitals:   02/11/20 0943  BP: 120/72   Pulse: 73  Weight: 156 lb (70.8 kg)    Fetal Status: Fetal Heart Rate (bpm): 156         Physical Exam: BP 120/72   Pulse 73   Wt 156 lb (70.8 kg)   LMP 11/13/2019   BMI 27.63 kg/m  CONSTITUTIONAL: Well-developed, well-nourished female in no acute distress.  NEUROLOGIC: Alert and oriented to person, place, and time. Normal reflexes, muscle tone coordination. No cranial nerve deficit noted. PSYCHIATRIC: Normal mood and affect. Normal behavior. Normal judgment and thought content. SKIN: Skin is warm and dry. No rash noted. Not diaphoretic. No erythema. No pallor. HENT:  Normocephalic, atraumatic, External right and left ear normal. Oropharynx is clear and moist EYES: Conjunctivae and EOM are normal.  NECK: Normal range of motion, supple, no masses CARDIOVASCULAR: Normal heart rate noted, regular rhythm RESPIRATORY: Effort and breath sounds normal, no problems with respiration noted BREASTS: symmetric, non-tender, no masses palpable ABDOMEN: Soft, nontender, nondistended, gravid. GU: normal appearing external female genitalia, multiparous normal appearing cervix, scant white discharge in vagina, no lesions noted Bimanual: 12 weeks sized uterus, no adnexal tenderness or palpable lesions noted MUSCULOSKELETAL: Normal range of motion. EXT:  No edema and no tenderness. 2+ distal pulses.   Assessment and Plan:  Pregnancy: [id number] at 64w6dby sure LMP  1. Encounter for supervision of other normal pregnancy in first trimester Routine care for now - Cervicovaginal ancillary only( [place]) - Culture, OB Urine - CBC/D/Plt+RPR+Rh+ABO+Rub Ab... - Genetic Screening  2. Hx of preeclampsia, prior pregnancy, currently pregnant Check baseline labs - aspirin 81 MG chewable tablet; Chew 1 tablet (81 mg total) by mouth daily.  Dispense: 30 tablet; Refill: 8 - Comprehensive metabolic panel - Protein / creatinine ratio, urine  3. Asthma complicating pregnancy in first trimester   4. [redacted]  weeks gestation of pregnancy   5. Supervision of other normal pregnancy, antepartum  - [place]FM OB DETAIL +14 WK; Future   Preterm labor symptoms and general obstetric precautions including but not limited to vaginal bleeding, contractions, leaking of fluid and fetal movement were reviewed in detail with the patient.  Please refer to After Visit Summary for other counseling recommendations.   Return in about 4 weeks (around 03/10/2020) for ROB, in person.  LGriffin Basil11/07/2019 12:58 PM

## 2020-02-12 LAB — PROTEIN / CREATININE RATIO, URINE
Creatinine, Urine: 284.8 mg/dL
Protein, Ur: 70.2 mg/dL
Protein/Creat Ratio: 246 mg/g creat — ABNORMAL HIGH (ref 0–200)

## 2020-02-12 LAB — COMPREHENSIVE METABOLIC PANEL
ALT: 10 IU/L (ref 0–32)
AST: 12 IU/L (ref 0–40)
Albumin/Globulin Ratio: 1.3 (ref 1.2–2.2)
Albumin: 4.2 g/dL (ref 3.9–5.0)
Alkaline Phosphatase: 56 IU/L (ref 44–121)
BUN/Creatinine Ratio: 13 (ref 9–23)
BUN: 9 mg/dL (ref 6–20)
Bilirubin Total: 0.3 mg/dL (ref 0.0–1.2)
CO2: 21 mmol/L (ref 20–29)
Calcium: 9.6 mg/dL (ref 8.7–10.2)
Chloride: 102 mmol/L (ref 96–106)
Creatinine, Ser: 0.68 mg/dL (ref 0.57–1.00)
GFR calc Af Amer: 141 mL/min/{1.73_m2} (ref >59)
GFR calc non Af Amer: 122 mL/min/{1.73_m2} (ref >59)
Globulin, Total: 3.3 g/dL (ref 1.5–4.5)
Glucose: 87 mg/dL (ref 65–99)
Potassium: 4.4 mmol/L (ref 3.5–5.2)
Sodium: 136 mmol/L (ref 134–144)
Total Protein: 7.5 g/dL (ref 6.0–8.5)

## 2020-02-12 LAB — CBC/D/PLT+RPR+RH+ABO+RUB AB...
Antibody Screen: NEGATIVE
Basophils Absolute: 0.1 10*3/uL (ref 0.0–0.2)
Basos: 1 %
EOS (ABSOLUTE): 0.4 10*3/uL (ref 0.0–0.4)
Eos: 3 %
HCV Ab: <0.1 s/co ratio (ref 0.0–0.9)
HIV Screen 4th Generation wRfx: NONREACTIVE
Hematocrit: 39 % (ref 34.0–46.6)
Hemoglobin: 12.9 g/dL (ref 11.1–15.9)
Hepatitis B Surface Ag: NEGATIVE
Immature Grans (Abs): 0.1 10*3/uL (ref 0.0–0.1)
Immature Granulocytes: 1 %
Lymphocytes Absolute: 2.6 10*3/uL (ref 0.7–3.1)
Lymphs: 22 %
MCH: 29.5 pg (ref 26.6–33.0)
MCHC: 33.1 g/dL (ref 31.5–35.7)
MCV: 89 fL (ref 79–97)
Monocytes Absolute: 0.7 10*3/uL (ref 0.1–0.9)
Monocytes: 6 %
Neutrophils Absolute: 7.9 10*3/uL — ABNORMAL HIGH (ref 1.4–7.0)
Neutrophils: 67 %
Platelets: 280 10*3/uL (ref 150–450)
RBC: 4.38 x10E6/uL (ref 3.77–5.28)
RDW: 12.2 % (ref 11.7–15.4)
RPR Ser Ql: NONREACTIVE
Rh Factor: POSITIVE
Rubella Antibodies, IGG: 2.42 index (ref >0.99)
WBC: 11.7 10*3/uL — ABNORMAL HIGH (ref 3.4–10.8)

## 2020-02-12 LAB — HCV INTERPRETATION

## 2020-02-13 LAB — CULTURE, OB URINE

## 2020-02-13 LAB — URINE CULTURE, OB REFLEX

## 2020-02-15 LAB — CERVICOVAGINAL ANCILLARY ONLY
Bacterial Vaginitis (gardnerella): POSITIVE — AB
Candida Glabrata: NEGATIVE
Candida Vaginitis: NEGATIVE
Chlamydia: NEGATIVE
Comment: NEGATIVE
Comment: NEGATIVE
Comment: NEGATIVE
Comment: NEGATIVE
Comment: NEGATIVE
Comment: NORMAL
Neisseria Gonorrhea: NEGATIVE
Trichomonas: NEGATIVE

## 2020-02-16 ENCOUNTER — Other Ambulatory Visit: Payer: Self-pay

## 2020-02-16 DIAGNOSIS — B9689 Other specified bacterial agents as the cause of diseases classified elsewhere: Secondary | ICD-10-CM

## 2020-02-16 MED ORDER — METRONIDAZOLE 500 MG PO TABS: 500.0000 mg | ORAL_TABLET | Freq: Two times a day (BID) | ORAL | 0 refills | Status: DC

## 2020-02-17 ENCOUNTER — Telehealth: Payer: Self-pay

## 2020-02-17 ENCOUNTER — Encounter: Payer: Self-pay | Admitting: Obstetrics and Gynecology

## 2020-02-17 NOTE — Telephone Encounter (Signed)
-----   Message from Warden Fillers, MD sent at 02/15/2020  9:52 PM EST ----- Bacterial vaginosis seen on vaginal swab, pt needs treatment with flagyl 500 mg po BID x 7 days

## 2020-02-17 NOTE — Telephone Encounter (Signed)
Call patient no answer or voice mail to leave a message. 

## 2020-02-22 ENCOUNTER — Encounter: Payer: Self-pay | Admitting: Obstetrics and Gynecology

## 2020-03-10 ENCOUNTER — Other Ambulatory Visit: Payer: Self-pay

## 2020-03-10 ENCOUNTER — Ambulatory Visit (INDEPENDENT_AMBULATORY_CARE_PROVIDER_SITE_OTHER): Payer: Medicaid Other | Admitting: Obstetrics and Gynecology

## 2020-03-10 DIAGNOSIS — Z3481 Encounter for supervision of other normal pregnancy, first trimester: Secondary | ICD-10-CM

## 2020-03-10 NOTE — Progress Notes (Signed)
   PRENATAL VISIT NOTE  Subjective:  Victoria Mills is a 24 y.o. [phone] at [redacted]w[redacted]d being seen today for ongoing prenatal care.  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  Patient reports no complaints.  Contractions: Not present. Vag. Bleeding: None.   . Denies leaking of fluid.   The following portions of the patient's history were reviewed and updated as appropriate: allergies, current medications, past family history, past medical history, past social history, past surgical history and problem list.   Objective:   Vitals:   03/10/20 0956  BP: 122/71  Pulse: 90  Weight: 158 lb (71.7 kg)    Fetal Status: Fetal Heart Rate (bpm): 150         General:  Alert, oriented and cooperative. Patient is in no acute distress.  Skin: Skin is warm and dry. No rash noted.   Cardiovascular: Normal heart rate noted  Respiratory: Normal respiratory effort, no problems with respiration noted  Abdomen: Soft, gravid, appropriate for gestational age.  Pain/Pressure: Present     Pelvic: Cervical exam deferred        Extremities: Normal range of motion.     Mental Status: Normal mood and affect. Normal behavior. Normal judgment and thought content.   Assessment and Plan:  Pregnancy: [id number] at 106w6d  1. Encounter for supervision of other normal pregnancy, first trimester  - AFP, Serum, Open Spina Bifida - Doing well - considering BTL, discussed  time to think about this. This is permanent sterilization.  - Continue BASA   Preterm labor symptoms and general obstetric precautions including but not limited to vaginal bleeding, contractions, leaking of fluid and fetal movement were reviewed in detail with the patient. Please refer to After Visit Summary for other counseling recommendations.   No follow-ups on file.  Future Appointments  Date Time Provider Department Center  03/31/2020  8:00 AM [facility] NURSE [facility]  03/31/2020  8:15 AM [facility] US[facility]  04/14/2020  9:45 AM Constant, Gigi Gin, MD [facility] None    Venia Carbon, NP

## 2020-03-10 NOTE — Progress Notes (Signed)
Pt made aware of results from last visit, pt is taking Flagyl.

## 2020-03-17 LAB — AFP, SERUM, OPEN SPINA BIFIDA
AFP MoM: 0.53
AFP Value: 19.1 ng/mL
Gest. Age on Collection Date: 16 weeks
Maternal Age At EDD: 26.2 yr
OSBR Risk 1 IN: 10000
Test Results:: NEGATIVE
Weight: 158 [lb_av]

## 2020-03-31 ENCOUNTER — Ambulatory Visit: Payer: Medicaid Other | Attending: Obstetrics and Gynecology

## 2020-03-31 ENCOUNTER — Other Ambulatory Visit: Payer: Self-pay | Admitting: *Deleted

## 2020-03-31 ENCOUNTER — Ambulatory Visit: Payer: Medicaid Other | Admitting: *Deleted

## 2020-03-31 ENCOUNTER — Other Ambulatory Visit: Payer: Self-pay

## 2020-03-31 ENCOUNTER — Encounter: Payer: Self-pay | Admitting: *Deleted

## 2020-03-31 DIAGNOSIS — Z3481 Encounter for supervision of other normal pregnancy, first trimester: Secondary | ICD-10-CM | POA: Diagnosis present

## 2020-03-31 DIAGNOSIS — O99891 Other specified diseases and conditions complicating pregnancy: Secondary | ICD-10-CM | POA: Diagnosis not present

## 2020-03-31 DIAGNOSIS — J45909 Unspecified asthma, uncomplicated: Secondary | ICD-10-CM | POA: Diagnosis not present

## 2020-03-31 DIAGNOSIS — Z363 Encounter for antenatal screening for malformations: Secondary | ICD-10-CM

## 2020-03-31 DIAGNOSIS — O09212 Supervision of pregnancy with history of pre-term labor, second trimester: Secondary | ICD-10-CM | POA: Insufficient documentation

## 2020-03-31 DIAGNOSIS — Z362 Encounter for other antenatal screening follow-up: Secondary | ICD-10-CM

## 2020-03-31 DIAGNOSIS — Z3480 Encounter for supervision of other normal pregnancy, unspecified trimester: Secondary | ICD-10-CM

## 2020-03-31 DIAGNOSIS — Z3A19 19 weeks gestation of pregnancy: Secondary | ICD-10-CM | POA: Diagnosis not present

## 2020-03-31 DIAGNOSIS — O09292 Supervision of pregnancy with other poor reproductive or obstetric history, second trimester: Secondary | ICD-10-CM | POA: Insufficient documentation

## 2020-04-09 NOTE — L&D Delivery Note (Signed)
Delivery Note Called to bedside and patient complete and pushing. At 2:52 AM a viable female was delivered via Vaginal, Spontaneous (Presentation: Left Occiput Anterior).  APGAR: 9, 9; weight pending.   Placenta status: Spontaneous, Intact.  Cord: 3 vessels with the following complications: None.  Placenta sent to pathology for marginal cord insert.  Anesthesia: Epidural Episiotomy: None Lacerations: None Est. Blood Loss (mL): 50  Mom to postpartum.  Baby to Couplet care / Skin to Skin.  Alric Seton 07/30/2020, 3:21 AM

## 2020-04-14 ENCOUNTER — Encounter: Payer: Self-pay | Admitting: Obstetrics and Gynecology

## 2020-04-14 ENCOUNTER — Ambulatory Visit (INDEPENDENT_AMBULATORY_CARE_PROVIDER_SITE_OTHER): Payer: Medicaid Other | Admitting: Obstetrics and Gynecology

## 2020-04-14 ENCOUNTER — Other Ambulatory Visit: Payer: Self-pay

## 2020-04-14 VITALS — BP 111/73 | HR 78 | Wt 165.0 lb

## 2020-04-14 DIAGNOSIS — O09299 Supervision of pregnancy with other poor reproductive or obstetric history, unspecified trimester: Secondary | ICD-10-CM

## 2020-04-14 DIAGNOSIS — Z3481 Encounter for supervision of other normal pregnancy, first trimester: Secondary | ICD-10-CM

## 2020-04-14 NOTE — Progress Notes (Signed)
ROB, reports no problems today. 

## 2020-04-14 NOTE — Progress Notes (Signed)
   PRENATAL VISIT NOTE  Subjective:  Victoria Mills is a 25 y.o. [phone] at [redacted]w[redacted]d being seen today for ongoing prenatal care.  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  Patient reports no complaints.  Contractions: Not present. Vag. Bleeding: None.  Movement: Present. Denies leaking of fluid.   The following portions of the patient's history were reviewed and updated as appropriate: allergies, current medications, past family history, past medical history, past social history, past surgical history and problem list.   Objective:   Vitals:   04/14/20 0952  BP: 111/73  Pulse: 78  Weight: 165 lb (74.8 kg)    Fetal Status: Fetal Heart Rate (bpm): 149 Fundal Height: 23 cm Movement: Present     General:  Alert, oriented and cooperative. Patient is in no acute distress.  Skin: Skin is warm and dry. No rash noted.   Cardiovascular: Normal heart rate noted  Respiratory: Normal respiratory effort, no problems with respiration noted  Abdomen: Soft, gravid, appropriate for gestational age.  Pain/Pressure: Absent     Pelvic: Cervical exam deferred        Extremities: Normal range of motion.  Edema: None  Mental Status: Normal mood and affect. Normal behavior. Normal judgment and thought content.   Assessment and Plan:  Pregnancy: [id number] at [redacted]w[redacted]d 1. Encounter for supervision of other normal pregnancy, first trimester Patient is doing well without complaints Reviewed ultrasound report Follow up ultrasound scheduled on 04/28/20 Patient plans for Nexplanon for contraception  2. Hx of preeclampsia, prior pregnancy, currently pregnant Continue ASA  Preterm labor symptoms  and general obstetric precautions including but not limited to vaginal bleeding, contractions, leaking of fluid and fetal movement were reviewed in detail with the patient. Please refer to After Visit Summary for other counseling recommendations.   Return in about 4 weeks (around 05/12/2020) for in person, ROB, High risk.  Future Appointments  Date Time Provider Department Center  04/28/2020  7:45 AM [facility] NURSE [facility]  04/28/2020  8:00 AM [facility] US1 [facility]    Catalina Antigua, MD

## 2020-04-28 ENCOUNTER — Ambulatory Visit: Payer: Medicaid Other | Attending: Obstetrics and Gynecology

## 2020-04-28 ENCOUNTER — Ambulatory Visit: Payer: Medicaid Other

## 2020-05-05 ENCOUNTER — Other Ambulatory Visit: Payer: Self-pay

## 2020-05-05 ENCOUNTER — Other Ambulatory Visit: Payer: Self-pay | Admitting: *Deleted

## 2020-05-05 ENCOUNTER — Encounter: Payer: Self-pay | Admitting: *Deleted

## 2020-05-05 ENCOUNTER — Ambulatory Visit: Payer: Medicaid Other | Attending: Obstetrics and Gynecology | Admitting: *Deleted

## 2020-05-05 ENCOUNTER — Ambulatory Visit (HOSPITAL_BASED_OUTPATIENT_CLINIC_OR_DEPARTMENT_OTHER): Payer: Medicaid Other

## 2020-05-05 DIAGNOSIS — Z3A24 24 weeks gestation of pregnancy: Secondary | ICD-10-CM | POA: Insufficient documentation

## 2020-05-05 DIAGNOSIS — O09293 Supervision of pregnancy with other poor reproductive or obstetric history, third trimester: Secondary | ICD-10-CM | POA: Diagnosis not present

## 2020-05-05 DIAGNOSIS — J45909 Unspecified asthma, uncomplicated: Secondary | ICD-10-CM | POA: Diagnosis not present

## 2020-05-05 DIAGNOSIS — Z3481 Encounter for supervision of other normal pregnancy, first trimester: Secondary | ICD-10-CM

## 2020-05-05 DIAGNOSIS — O99512 Diseases of the respiratory system complicating pregnancy, second trimester: Secondary | ICD-10-CM | POA: Insufficient documentation

## 2020-05-05 DIAGNOSIS — O365920 Maternal care for other known or suspected poor fetal growth, second trimester, not applicable or unspecified: Secondary | ICD-10-CM | POA: Insufficient documentation

## 2020-05-05 DIAGNOSIS — Z363 Encounter for antenatal screening for malformations: Secondary | ICD-10-CM | POA: Diagnosis not present

## 2020-05-05 DIAGNOSIS — O0933 Supervision of pregnancy with insufficient antenatal care, third trimester: Secondary | ICD-10-CM | POA: Insufficient documentation

## 2020-05-05 DIAGNOSIS — Z362 Encounter for other antenatal screening follow-up: Secondary | ICD-10-CM

## 2020-05-12 ENCOUNTER — Ambulatory Visit (INDEPENDENT_AMBULATORY_CARE_PROVIDER_SITE_OTHER): Payer: Medicaid Other | Admitting: Obstetrics & Gynecology

## 2020-05-12 ENCOUNTER — Other Ambulatory Visit: Payer: Self-pay

## 2020-05-12 ENCOUNTER — Encounter: Payer: Self-pay | Admitting: Obstetrics & Gynecology

## 2020-05-12 VITALS — BP 133/76 | HR 76 | Wt 171.0 lb

## 2020-05-12 DIAGNOSIS — O0992 Supervision of high risk pregnancy, unspecified, second trimester: Secondary | ICD-10-CM

## 2020-05-12 DIAGNOSIS — O365931 Maternal care for other known or suspected poor fetal growth, third trimester, fetus 1: Secondary | ICD-10-CM | POA: Insufficient documentation

## 2020-05-12 DIAGNOSIS — O09299 Supervision of pregnancy with other poor reproductive or obstetric history, unspecified trimester: Secondary | ICD-10-CM

## 2020-05-12 DIAGNOSIS — Z3A25 25 weeks gestation of pregnancy: Secondary | ICD-10-CM

## 2020-05-12 DIAGNOSIS — O365920 Maternal care for other known or suspected poor fetal growth, second trimester, not applicable or unspecified: Secondary | ICD-10-CM

## 2020-05-12 NOTE — Progress Notes (Signed)
PRENATAL VISIT NOTE  Subjective:  Victoria Mills is a 25 y.o. [phone] at [redacted]w[redacted]d being seen today for ongoing prenatal care.  She is currently monitored for the following issues for this high-risk pregnancy and has Supervision of high-risk pregnancy; Hx of preeclampsia, prior pregnancy, currently pregnant; Asthma complicating pregnancy, antepartum; and IUGR (intrauterine growth restriction) affecting care of mother, second trimester on their problem list.  Patient reports no complaints.  Contractions: Not present. Vag. Bleeding: None.  Movement: Present. Denies leaking of fluid.   The following portions of the patient's history were reviewed and updated as appropriate: allergies, current medications, past family history, past medical history, past social history, past surgical history and problem list.   Objective:   Vitals:   05/12/20 1000  BP: 133/76  Pulse: 76  Weight: 171 lb (77.6 kg)    Fetal Status: Fetal Heart Rate (bpm): 141   Movement: Present     General:  Alert, oriented and cooperative. Patient is in no acute distress.  Skin: Skin is warm and dry. No rash noted.   Cardiovascular: Normal heart rate noted  Respiratory: Normal respiratory effort, no problems with respiration noted  Abdomen: Soft, gravid, appropriate for gestational age.  Pain/Pressure: Absent     Pelvic: Cervical exam deferred        Extremities: Normal range of motion.  Edema: None  Mental Status: Normal mood and affect. Normal behavior. Normal judgment and thought content.   Imaging: [place] MFM OB FOLLOW UP  Result Date: 05/05/2020 ----------------------------------------------------------------------  OBSTETRICS REPORT                       (Signed Final 05/05/2020 09:19 am) ---------------------------------------------------------------------- Patient Info  ID #:       [id number]                          D.O.B.:  January 20, 1996 (24 yrs)  Name:       Victoria Mills                     Visit Date: 05/05/2020 08:15 am  ---------------------------------------------------------------------- Performed By  Attending:        Lin Landsman      Secondary Phy.:   [facility]                    MD  Performed By:     Truitt Leep,      Address:          [address]                    [address]                                                             [address]                                                             [phone]  [place]                                                             [address]  Referred By:      Regis Bill             [facility] MD                                  [facility]  Ref. Address:     [facility]                    [facility]                    Healthcare ---------------------------------------------------------------------- Orders  #  Description                           Code        Ordered By  1  [place] MFM OB FOLLOW UP                   [phone]    Noralee Space ----------------------------------------------------------------------  #  Order #                     Accession #                Episode #  1  [id number]                   [id number]                 [id number] ---------------------------------------------------------------------- Indications  Encounter for antenatal screening for          Z36.3  malformations  Poor obstetric history: Previous               O09.299  preeclampsia / eclampsia/gestational HTN  Asthma                                         O99.89 j45.909  Poor obstetric history: Previous preterm       O09.219  delivery, antepartum (29 week induction)  [redacted] weeks gestation of pregnancy  Z3A.24 ---------------------------------------------------------------------- Fetal Evaluation  Num Of Fetuses:         1  Cardiac  Activity:       Observed  Presentation:           Cephalic  Placenta:               Posterior  P. Cord Insertion:      Previously Visualized  Amniotic Fluid  AFI FV:      Within normal limits                              Largest Pocket(cm)                              6.56 ---------------------------------------------------------------------- Biometry  BPD:        58  mm     G. Age:  23w 5d         11  %    CI:        75.92   %    70 - 86                                                          FL/HC:      19.4   %    18.7 - 20.3  HC:       211   mm     G. Age:  23w 1d        1.1  %    HC/AC:      1.06        1.04 - 1.22  AC:      199.6  mm     G. Age:  24w 4d         33  %    FL/BPD:     70.5   %    71 - 87  FL:       40.9  mm     G. Age:  23w 2d        4.3  %    FL/AC:      20.5   %    20 - 24  Est. FW:     643  gm      1 lb 7 oz     10  % ---------------------------------------------------------------------- OB History  Blood Type:   O+  Gravidity:    4         Term:   2        Prem:   1  Living:       2 ---------------------------------------------------------------------- Gestational Age  LMP:           24w 6d        Date:  11/13/19                 EDD:   08/19/20  U/S Today:     23w 5d                                        EDD:   08/27/20  Best:  24w 6d     Det. By:  LMP  (11/13/19)          EDD:   08/19/20 ---------------------------------------------------------------------- Anatomy  Cranium:               Appears normal         Heart:                  Appears normal                                                                        (4CH, axis, and                                                                        situs)  Cavum:                 Appears normal         RVOT:                   Appears normal  Ventricles:            Appears normal         LVOT:                   Appears normal  Choroid Plexus:        Appears normal         Aortic Arch:            Appears normal  Cerebellum:             Appears normal         Stomach:                Appears normal, left                                                                        sided  Face:                  Appears normal         Kidneys:                Appear normal                         (orbits and profile)  Lips:                  Appears normal         Bladder:                Appears normal  Palate:  Appears normal         Upper Extremities:      Appears normal  Other:  Other anatomy previously seen and appeared normal. Hands          visualized. ---------------------------------------------------------------------- Doppler - Fetal Vessels  Umbilical Artery   S/D     %tile      RI    %tile      PI    %tile    3.1       36    0.68       42    1.07       45 ---------------------------------------------------------------------- Impression  Follow up growth due to complete the fetal anatomy.  Normal interval growth with measurements consistent with  dates, however, the EFW is at the 10th% with normal AC.  Good fetal movement and amniotic fluid volume  The UA dopplers are normal today without evidence of AEDF  or REDF.  Single intrauterine pregnancy here for a follow up growth due  to late prenatal care.  Normal anatomy with measurements less than dates due to  IUGR EFW 8th%.  There is good fetal movement and amniotic fluid volume  The UA Dopplers are normal.  I discussed today's visit with a diagnosis of IUGR. I explained  that the etiology includes placental insufficiency, chronic  disease, infection, aneuploidy and other genetic syndromes.  She has a low risk NIPS and neg horizon. At this time I  explained the diagnosis, evaluation and management to  include on going fetal growth and weekly antenatal testing to  include UA Dopplers. If the EFW < 3rd% or abnormal testing,  I recommend delivery at 37 weeks otherwise if all is normal  consider delivery at 39 weeks.  ---------------------------------------------------------------------- Recommendations  Follow up growth in 4 weeks  NST with UA Dopplers in 2 weeks. ----------------------------------------------------------------------               Lin Landsman, MD Electronically Signed Final Report   05/05/2020 09:19 am ----------------------------------------------------------------------   Assessment and Plan:  Pregnancy: [id number] at [redacted]w[redacted]d 1. IUGR (intrauterine growth restriction) affecting care of mother, second trimester Recent EFW 10%, normal dopplers. Continue scans, doppler studies, subsequent antenatal testing and delivery plan as per MFM recommendations.    2. Hx of preeclampsia, prior pregnancy, currently pregnant Stable BP but in the 130s/70s. Continue ASA. PEC precautions reviewed.  3. [redacted] weeks gestation of pregnancy 4. Supervision of high risk pregnancy in second trimester Third trimester labs next visit. Will also get Tdap then, declined Flu vaccine. Preterm labor symptoms and general obstetric precautions including but not limited to vaginal bleeding, contractions, leaking of fluid and fetal movement were reviewed in detail with the patient. Please refer to After Visit Summary for other counseling recommendations.   Return in about 2 weeks (around 05/26/2020) for 2 hr GTT, 3rd trimester labs, TDap, OFFICE OB VISIT (MD only).  Future Appointments  Date Time Provider Department Center  05/19/2020  8:30 AM [facility] NURSE [facility]  05/19/2020  8:45 AM [facility] US5 [facility]  06/02/2020  9:00 AM [facility]URSE [facility]  06/02/2020  9:15 AM [facility]2 [facility]    Jaynie Collins, MD

## 2020-05-12 NOTE — Patient Instructions (Signed)
Return to office for any scheduled appointments. Call the office or go to the MAU at [facility] if:  You begin to have strong, frequent contractions  Your water breaks.  Sometimes it is a big gush of fluid, sometimes it is just a trickle that keeps getting your panties wet or running down your legs  You have vaginal bleeding.  It is normal to have a small amount of spotting if your cervix was checked.   You do not feel your baby moving like normal.  If you do not, get something to eat and drink and lay down and focus on feeling your baby move.   If your baby is still not moving like normal, you should call the office or go to MAU.  Any other obstetric concerns.   

## 2020-05-19 ENCOUNTER — Ambulatory Visit: Payer: Medicaid Other | Admitting: *Deleted

## 2020-05-19 ENCOUNTER — Encounter: Payer: Self-pay | Admitting: *Deleted

## 2020-05-19 ENCOUNTER — Other Ambulatory Visit: Payer: Self-pay

## 2020-05-19 ENCOUNTER — Ambulatory Visit (HOSPITAL_BASED_OUTPATIENT_CLINIC_OR_DEPARTMENT_OTHER): Payer: Medicaid Other | Admitting: *Deleted

## 2020-05-19 ENCOUNTER — Other Ambulatory Visit: Payer: Self-pay | Admitting: *Deleted

## 2020-05-19 ENCOUNTER — Ambulatory Visit: Payer: Medicaid Other | Attending: Maternal & Fetal Medicine

## 2020-05-19 DIAGNOSIS — O365920 Maternal care for other known or suspected poor fetal growth, second trimester, not applicable or unspecified: Secondary | ICD-10-CM | POA: Diagnosis not present

## 2020-05-19 DIAGNOSIS — Z3A26 26 weeks gestation of pregnancy: Secondary | ICD-10-CM | POA: Diagnosis not present

## 2020-05-19 DIAGNOSIS — O365921 Maternal care for other known or suspected poor fetal growth, second trimester, fetus 1: Secondary | ICD-10-CM

## 2020-05-19 NOTE — Procedures (Signed)
Victoria Mills 1995/12/19 [redacted]w[redacted]d  Fetus A Non-Stress Test Interpretation for 05/19/20  Indication: IUGR  Fetal Heart Rate A Mode: External Baseline Rate (A): 135 bpm Variability: Moderate Accelerations: 10 x 10 Decelerations: None Multiple birth?: No  Uterine Activity Mode: Palpation,Toco Contraction Frequency (min): 1 UC Contraction Duration (sec): 60 Contraction Quality: Mild Resting Tone Palpated: Relaxed Resting Time: Adequate  Interpretation (Fetal Testing) Nonstress Test Interpretation: Reactive Overall Impression: Reassuring for gestational age Comments: Dr. Parke Poisson reviewed tracing.

## 2020-05-27 ENCOUNTER — Encounter: Payer: Self-pay | Admitting: Obstetrics and Gynecology

## 2020-05-27 ENCOUNTER — Ambulatory Visit (INDEPENDENT_AMBULATORY_CARE_PROVIDER_SITE_OTHER): Payer: Medicaid Other | Admitting: Obstetrics and Gynecology

## 2020-05-27 ENCOUNTER — Other Ambulatory Visit: Payer: Self-pay

## 2020-05-27 ENCOUNTER — Other Ambulatory Visit: Payer: Medicaid Other

## 2020-05-27 VITALS — BP 127/72 | HR 83 | Wt 177.0 lb

## 2020-05-27 DIAGNOSIS — O09299 Supervision of pregnancy with other poor reproductive or obstetric history, unspecified trimester: Secondary | ICD-10-CM

## 2020-05-27 DIAGNOSIS — Z23 Encounter for immunization: Secondary | ICD-10-CM

## 2020-05-27 DIAGNOSIS — Z3A28 28 weeks gestation of pregnancy: Secondary | ICD-10-CM

## 2020-05-27 DIAGNOSIS — O365931 Maternal care for other known or suspected poor fetal growth, third trimester, fetus 1: Secondary | ICD-10-CM

## 2020-05-27 DIAGNOSIS — O0990 Supervision of high risk pregnancy, unspecified, unspecified trimester: Secondary | ICD-10-CM | POA: Diagnosis not present

## 2020-05-27 DIAGNOSIS — J45909 Unspecified asthma, uncomplicated: Secondary | ICD-10-CM

## 2020-05-27 DIAGNOSIS — Z3480 Encounter for supervision of other normal pregnancy, unspecified trimester: Secondary | ICD-10-CM

## 2020-05-27 DIAGNOSIS — O99519 Diseases of the respiratory system complicating pregnancy, unspecified trimester: Secondary | ICD-10-CM

## 2020-05-27 NOTE — Progress Notes (Signed)
ROB 28w  GTT today.  TDAP received today    CC: Back pain

## 2020-05-27 NOTE — Patient Instructions (Signed)
Back Pain in Pregnancy Back pain during pregnancy is common. Back pain may be caused by several factors that are related to changes during your pregnancy. Follow these instructions at home: Managing pain, stiffness, and swelling  If directed, for sudden (acute) back pain, put ice on the painful area. ? Put ice in a plastic bag. ? Place a towel between your skin and the bag. ? Leave the ice on for 20 minutes, 2-3 times per day.  If directed, apply heat to the affected area before you exercise. Use the heat source that your health care provider recommends, such as a moist heat pack or a heating pad. ? Place a towel between your skin and the heat source. ? Leave the heat on for 20-30 minutes. ? Remove the heat if your skin turns bright red. This is especially important if you are unable to feel pain, heat, or cold. You may have a greater risk of getting burned.  If directed, massage the affected area.      Activity  Exercise as told by your health care provider. Gentle exercise is the best way to prevent or manage back pain.  Listen to your body when lifting. If lifting hurts, ask for help or bend your knees. This uses your leg muscles instead of your back muscles.  Squat down when picking up something from the floor. Do not bend over.  Only use bed rest for short periods as told by your health care provider. Bed rest should only be used for the most severe episodes of back pain. Standing, sitting, and lying down  Do not stand in one place for long periods of time.  Use good posture when sitting. Make sure your head rests over your shoulders and is not hanging forward. Use a pillow on your lower back if necessary.  Try sleeping on your side, preferably the left side, with a pregnancy support pillow or 1-2 regular pillows between your legs. ? If you have back pain after a night's rest, your bed may be too soft. ? A firm mattress may provide more support for your back during  pregnancy. General instructions  Do not wear high heels.  Eat a healthy diet. Try to gain weight within your health care provider's recommendations.  Use a maternity girdle, elastic sling, or back brace as told by your health care provider.  Take over-the-counter and prescription medicines only as told by your health care provider.  Work with a physical therapist or massage therapist to find ways to manage back pain. Acupuncture or massage therapy may be helpful.  Keep all follow-up visits as told by your health care provider. This is important. Contact a health care provider if:  Your back pain interferes with your daily activities.  You have increasing pain in other parts of your body. Get help right away if:  You develop numbness, tingling, weakness, or problems with the use of your arms or legs.  You develop severe back pain that is not controlled with medicine.  You have a change in bowel or bladder control.  You develop shortness of breath, dizziness, or you faint.  You develop nausea, vomiting, or sweating.  You have back pain that is a rhythmic, cramping pain similar to labor pains. Labor pain is usually 1-2 minutes apart, lasts for about 1 minute, and involves a bearing down feeling or pressure in your pelvis.  You have back pain and your water breaks or you have vaginal bleeding.  You have back pain or   numbness that travels down your leg.  Your back pain developed after you fell.  You develop pain on one side of your back.  You see blood in your urine.  You develop skin blisters in the area of your back pain. Summary  Back pain may be caused by several factors that are related to changes during your pregnancy.  Follow instructions as told by your health care provider for managing pain, stiffness, and swelling.  Exercise as told by your health care provider. Gentle exercise is the best way to prevent or manage back pain.  Take over-the-counter and  prescription medicines only as told by your health care provider.  Keep all follow-up visits as told by your health care provider. This is important. This information is not intended to replace advice given to you by your health care provider. Make sure you discuss any questions you have with your health care provider. Document Revised: 07/15/2018 Document Reviewed: 09/11/2017 Elsevier Patient Education  2021 Elsevier Inc.  

## 2020-05-27 NOTE — Progress Notes (Signed)
   PRENATAL VISIT NOTE  Subjective:  Victoria Mills is a 25 y.o. [phone] at [redacted]w[redacted]d being seen today for ongoing prenatal care.  She is currently monitored for the following issues for this high-risk pregnancy and has Supervision of high-risk pregnancy; Hx of preeclampsia, prior pregnancy, currently pregnant; Asthma complicating pregnancy, antepartum; IUGR (intrauterine growth restriction) affecting care of mother, third trimester, fetus 1; and [redacted] weeks gestation of pregnancy on their problem list.  Patient doing well with no acute concerns today. She reports no complaints.  Contractions: Irritability. Vag. Bleeding: None.  Movement: Present. Denies leaking of fluid.   Pt received tdap today  The following portions of the patient's history were reviewed and updated as appropriate: allergies, current medications, past family history, past medical history, past social history, past surgical history and problem list. Problem list updated.  Objective:   Vitals:   05/27/20 0835  BP: 127/72  Pulse: 83  Weight: 177 lb (80.3 kg)    Fetal Status: Fetal Heart Rate (bpm): 146 Fundal Height: 26 cm Movement: Present     General:  Alert, oriented and cooperative. Patient is in no acute distress.  Skin: Skin is warm and dry. No rash noted.   Cardiovascular: Normal heart rate noted  Respiratory: Normal respiratory effort, no problems with respiration noted  Abdomen: Soft, gravid, appropriate for gestational age.  Pain/Pressure: Absent     Pelvic: Cervical exam deferred        Extremities: Normal range of motion.  Edema: None  Mental Status:  Normal mood and affect. Normal behavior. Normal judgment and thought content.   Assessment and Plan:  Pregnancy: G4P2102 at [redacted]w[redacted]d  1. Supervision of other normal pregnancy, antepartum   2. Supervision of high risk pregnancy, antepartum  - Glucose Tolerance, 2 Hours w/1 Hour - CBC - RPR - HIV Antibody (routine testing w rflx) - Tdap vaccine greater than or  equal to 7yo IM  3. Need for Tdap vaccination  - Tdap vaccine greater than or equal to 7yo IM  4. Asthma complicating pregnancy, antepartum   5. Hx of preeclampsia, prior pregnancy, currently pregnant No s/sx of preeclampsia  6. [redacted] weeks gestation of pregnancy  7. IUGR in third trimester   Pt has growth scan on 06/02/20, EFW improved to 10%   Preterm labor symptoms and general obstetric precautions including but not limited to vaginal bleeding, contractions, leaking of fluid and fetal movement were reviewed in detail with the patient.  Please refer to After Visit Summary for other counseling recommendations.   Return in about 2 weeks (around 06/10/2020) for [facility], in person.   Mariel Aloe, MD Faculty Attending Center for [facility]

## 2020-05-28 LAB — CBC
Hematocrit: 31.9 % — ABNORMAL LOW (ref 34.0–46.6)
Hemoglobin: 10.5 g/dL — ABNORMAL LOW (ref 11.1–15.9)
MCH: 29.3 pg (ref 26.6–33.0)
MCHC: 32.9 g/dL (ref 31.5–35.7)
MCV: 89 fL (ref 79–97)
Platelets: 185 10*3/uL (ref 150–450)
RBC: 3.58 x10E6/uL — ABNORMAL LOW (ref 3.77–5.28)
RDW: 11.6 % — ABNORMAL LOW (ref 11.7–15.4)
WBC: 13.9 10*3/uL — ABNORMAL HIGH (ref 3.4–10.8)

## 2020-05-28 LAB — GLUCOSE TOLERANCE, 2 HOURS W/ 1HR
Glucose, 1 hour: 106 mg/dL (ref 65–179)
Glucose, 2 hour: 121 mg/dL (ref 65–152)
Glucose, Fasting: 89 mg/dL (ref 65–91)

## 2020-05-28 LAB — HIV ANTIBODY (ROUTINE TESTING W REFLEX): HIV Screen 4th Generation wRfx: NONREACTIVE

## 2020-05-28 LAB — RPR: RPR Ser Ql: NONREACTIVE

## 2020-06-02 ENCOUNTER — Ambulatory Visit: Payer: Medicaid Other | Attending: Maternal & Fetal Medicine

## 2020-06-02 ENCOUNTER — Ambulatory Visit: Payer: Medicaid Other | Admitting: *Deleted

## 2020-06-02 ENCOUNTER — Other Ambulatory Visit: Payer: Self-pay

## 2020-06-02 ENCOUNTER — Ambulatory Visit (HOSPITAL_BASED_OUTPATIENT_CLINIC_OR_DEPARTMENT_OTHER): Payer: Medicaid Other | Admitting: *Deleted

## 2020-06-02 ENCOUNTER — Other Ambulatory Visit: Payer: Self-pay | Admitting: *Deleted

## 2020-06-02 DIAGNOSIS — O365930 Maternal care for other known or suspected poor fetal growth, third trimester, not applicable or unspecified: Secondary | ICD-10-CM

## 2020-06-02 DIAGNOSIS — Z3A28 28 weeks gestation of pregnancy: Secondary | ICD-10-CM | POA: Diagnosis not present

## 2020-06-02 DIAGNOSIS — O365931 Maternal care for other known or suspected poor fetal growth, third trimester, fetus 1: Secondary | ICD-10-CM

## 2020-06-02 DIAGNOSIS — O09213 Supervision of pregnancy with history of pre-term labor, third trimester: Secondary | ICD-10-CM

## 2020-06-02 DIAGNOSIS — O365920 Maternal care for other known or suspected poor fetal growth, second trimester, not applicable or unspecified: Secondary | ICD-10-CM | POA: Insufficient documentation

## 2020-06-02 DIAGNOSIS — O99513 Diseases of the respiratory system complicating pregnancy, third trimester: Secondary | ICD-10-CM

## 2020-06-02 DIAGNOSIS — O09293 Supervision of pregnancy with other poor reproductive or obstetric history, third trimester: Secondary | ICD-10-CM | POA: Diagnosis not present

## 2020-06-02 DIAGNOSIS — Z362 Encounter for other antenatal screening follow-up: Secondary | ICD-10-CM | POA: Diagnosis not present

## 2020-06-02 DIAGNOSIS — J45909 Unspecified asthma, uncomplicated: Secondary | ICD-10-CM

## 2020-06-02 NOTE — Procedures (Signed)
Victoria Mills [birth year] [redacted]w[redacted]d  Fetus A Non-Stress Test Interpretation for 06/02/20  Indication: IUGR  Fetal Heart Rate A Mode: External Baseline Rate (A): 140 bpm (Traced MHR intermittently due to fetal movement.) Variability: Moderate Accelerations: 15 x 15 Decelerations: None Multiple birth?: No  Uterine Activity Mode: Palpation,Toco Contraction Frequency (min): 1 uc with ui Contraction Duration (sec): 40 Contraction Quality: Mild Resting Tone Palpated: Relaxed Resting Time: Adequate  Interpretation (Fetal Testing) Nonstress Test Interpretation: Reactive Overall Impression: Reassuring for gestational age Comments: Dr. Parke Poisson reviewed tracing.

## 2020-06-09 ENCOUNTER — Encounter: Payer: Medicaid Other | Admitting: Obstetrics and Gynecology

## 2020-06-13 ENCOUNTER — Ambulatory Visit (INDEPENDENT_AMBULATORY_CARE_PROVIDER_SITE_OTHER): Payer: Medicaid Other | Admitting: Obstetrics & Gynecology

## 2020-06-13 ENCOUNTER — Other Ambulatory Visit: Payer: Self-pay

## 2020-06-13 DIAGNOSIS — O365931 Maternal care for other known or suspected poor fetal growth, third trimester, fetus 1: Secondary | ICD-10-CM

## 2020-06-13 NOTE — Progress Notes (Signed)
+   Fetal movement. Pt complains of bilateral leg pain and swelling x 1 week. Pt states it feels like "pins and needles" in her legs. Also states she is having lower back. Denies any problems with urination.

## 2020-06-13 NOTE — Patient Instructions (Signed)

## 2020-06-13 NOTE — Progress Notes (Signed)
   PRENATAL VISIT NOTE  Subjective:  Victoria Mills is a 25 y.o. [phone] at [redacted]w[redacted]d being seen today for ongoing prenatal care.  She is currently monitored for the following issues for this high-risk pregnancy and has Supervision of high-risk pregnancy; Hx of preeclampsia, prior pregnancy, currently pregnant; Asthma complicating pregnancy, antepartum; and IUGR (intrauterine growth restriction) affecting care of mother, third trimester, fetus 1 on their problem list.  Patient reports no complaints.  Contractions: Not present. Vag. Bleeding: None.  Movement: Present. Denies leaking of fluid.   The following portions of the patient's history were reviewed and updated as appropriate: allergies, current medications, past family history, past medical history, past social history, past surgical history and problem list.   Objective:   Vitals:   06/13/20 0941  BP: 125/67  Pulse: 99  Weight: 173 lb 14.4 oz (78.9 kg)    Fetal Status: Fetal Heart Rate (bpm): 135 Fundal Height: 32 cm Movement: Present     General:  Alert, oriented and cooperative. Patient is in no acute distress.  Skin: Skin is warm and dry. No rash noted.   Cardiovascular: Normal heart rate noted  Respiratory: Normal respiratory effort, no problems with respiration noted  Abdomen: Soft, gravid, appropriate for gestational age.  Pain/Pressure: Absent     Pelvic: Cervical exam deferred        Extremities: Normal range of motion.  Edema: Trace  Mental Status: Normal mood and affect. Normal behavior. Normal judgment and thought content.   Assessment and Plan:  Pregnancy: [id number] at [redacted]w[redacted]d 1. IUGR (intrauterine growth restriction) affecting care of mother, third trimester, fetus 1 F/u had normal interval growth  Preterm labor symptoms and general obstetric precautions including but not limited to vaginal bleeding, contractions, leaking of fluid and fetal movement were reviewed in detail with the patient. Please refer to After Visit  Summary for other counseling recommendations.   Return in about 2 weeks (around 06/27/2020).  Future Appointments  Date Time Provider Department Center  06/17/2020  9:15 AM [facility] NURSE [facility]  06/17/2020  9:30 AM [facility] US3 [facility]  06/24/2020 11:15 AM [facility]SE [facility]  06/24/2020 11:30 AM [facility]    Scheryl Darter, MD

## 2020-06-17 ENCOUNTER — Encounter: Payer: Self-pay | Admitting: *Deleted

## 2020-06-17 ENCOUNTER — Other Ambulatory Visit: Payer: Self-pay

## 2020-06-17 ENCOUNTER — Ambulatory Visit: Payer: Medicaid Other | Attending: Obstetrics

## 2020-06-17 ENCOUNTER — Ambulatory Visit: Payer: Medicaid Other | Admitting: *Deleted

## 2020-06-17 DIAGNOSIS — O365931 Maternal care for other known or suspected poor fetal growth, third trimester, fetus 1: Secondary | ICD-10-CM | POA: Diagnosis present

## 2020-06-24 ENCOUNTER — Ambulatory Visit: Payer: Medicaid Other | Admitting: *Deleted

## 2020-06-24 ENCOUNTER — Encounter: Payer: Self-pay | Admitting: *Deleted

## 2020-06-24 ENCOUNTER — Other Ambulatory Visit: Payer: Self-pay

## 2020-06-24 ENCOUNTER — Ambulatory Visit: Payer: Medicaid Other | Attending: Obstetrics

## 2020-06-24 ENCOUNTER — Other Ambulatory Visit: Payer: Self-pay | Admitting: *Deleted

## 2020-06-24 DIAGNOSIS — Z3A32 32 weeks gestation of pregnancy: Secondary | ICD-10-CM

## 2020-06-24 DIAGNOSIS — O09213 Supervision of pregnancy with history of pre-term labor, third trimester: Secondary | ICD-10-CM | POA: Diagnosis not present

## 2020-06-24 DIAGNOSIS — O365931 Maternal care for other known or suspected poor fetal growth, third trimester, fetus 1: Secondary | ICD-10-CM

## 2020-06-24 DIAGNOSIS — J45909 Unspecified asthma, uncomplicated: Secondary | ICD-10-CM

## 2020-06-24 DIAGNOSIS — Z362 Encounter for other antenatal screening follow-up: Secondary | ICD-10-CM

## 2020-06-24 DIAGNOSIS — O99513 Diseases of the respiratory system complicating pregnancy, third trimester: Secondary | ICD-10-CM

## 2020-06-24 DIAGNOSIS — Z8759 Personal history of other complications of pregnancy, childbirth and the puerperium: Secondary | ICD-10-CM

## 2020-06-27 ENCOUNTER — Encounter: Payer: Medicaid Other | Admitting: Obstetrics & Gynecology

## 2020-06-28 ENCOUNTER — Ambulatory Visit (INDEPENDENT_AMBULATORY_CARE_PROVIDER_SITE_OTHER): Payer: Medicaid Other | Admitting: Obstetrics & Gynecology

## 2020-06-28 ENCOUNTER — Other Ambulatory Visit: Payer: Self-pay

## 2020-06-28 ENCOUNTER — Encounter: Payer: Self-pay | Admitting: Obstetrics & Gynecology

## 2020-06-28 VITALS — BP 135/79 | HR 80 | Wt 175.3 lb

## 2020-06-28 DIAGNOSIS — O0993 Supervision of high risk pregnancy, unspecified, third trimester: Secondary | ICD-10-CM

## 2020-06-28 DIAGNOSIS — O09299 Supervision of pregnancy with other poor reproductive or obstetric history, unspecified trimester: Secondary | ICD-10-CM

## 2020-06-28 DIAGNOSIS — J45909 Unspecified asthma, uncomplicated: Secondary | ICD-10-CM

## 2020-06-28 DIAGNOSIS — O99519 Diseases of the respiratory system complicating pregnancy, unspecified trimester: Secondary | ICD-10-CM

## 2020-06-28 MED ORDER — FERROUS SULFATE 325 (65 FE) MG PO TABS: 325.0000 mg | ORAL_TABLET | ORAL | 3 refills | Status: DC

## 2020-06-28 NOTE — Patient Instructions (Signed)

## 2020-06-28 NOTE — Progress Notes (Signed)
   PRENATAL VISIT NOTE  Subjective:  Victoria Mills is a 25 y.o. [phone] at [redacted]w[redacted]d being seen today for ongoing prenatal care.  She is currently monitored for the following issues for this high-risk pregnancy and has Supervision of high-risk pregnancy; Hx of preeclampsia, prior pregnancy, currently pregnant; Asthma complicating pregnancy, antepartum; and IUGR (intrauterine growth restriction) affecting care of mother, third trimester, fetus 1 on their problem list.  Patient reports episodes of dizziness increased in the last week, no syncope.  Contractions: Not present. Vag. Bleeding: None.  Movement: Present. Denies leaking of fluid.   The following portions of the patient's history were reviewed and updated as appropriate: allergies, current medications, past family history, past medical history, past social history, past surgical history and problem list.   Objective:   Vitals:   06/28/20 0954  BP: 135/79  Pulse: 80  Weight: 175 lb 4.8 oz (79.5 kg)    Fetal Status: Fetal Heart Rate (bpm): 132   Movement: Present     General:  Alert, oriented and cooperative. Patient is in no acute distress.  Skin: Skin is warm and dry. No rash noted.   Cardiovascular: Normal heart rate noted  Respiratory: Normal respiratory effort, no problems with respiration noted  Abdomen: Soft, gravid, appropriate for gestational age.  Pain/Pressure: Absent     Pelvic: Cervical exam deferred        Extremities: Normal range of motion.  Edema: None  Mental Status: Normal mood and affect. Normal behavior. Normal judgment and thought content.   Assessment and Plan:  Pregnancy: [id number] at [redacted]w[redacted]d 1. Supervision of high risk pregnancy in third trimester F/u on sx of dizziness - CBC - ferrous sulfate 325 (65 FE) MG tablet; Take 1 tablet (325 mg total) by mouth every other day.  Dispense: 30 tablet; Refill: 3  2. Asthma complicating pregnancy, antepartum Uses inhaler 2/week  Preterm labor symptoms and general  obstetric precautions including but not limited to vaginal bleeding, contractions, leaking of fluid and fetal movement were reviewed in detail with the patient. Please refer to After Visit Summary for other counseling recommendations.   Return in about 2 weeks (around 07/12/2020).  Future Appointments  Date Time Provider Department Center  07/20/2020  2:30 PM [facility] NURSE [facility]  07/20/2020  2:45 PM [facility] US5 [facility]    Scheryl Darter, MD

## 2020-06-29 LAB — CBC
Hematocrit: 31.3 % — ABNORMAL LOW (ref 34.0–46.6)
Hemoglobin: 10.2 g/dL — ABNORMAL LOW (ref 11.1–15.9)
MCH: 29.1 pg (ref 26.6–33.0)
MCHC: 32.6 g/dL (ref 31.5–35.7)
MCV: 89 fL (ref 79–97)
Platelets: 207 10*3/uL (ref 150–450)
RBC: 3.5 x10E6/uL — ABNORMAL LOW (ref 3.77–5.28)
RDW: 11.8 % (ref 11.7–15.4)
WBC: 12.3 10*3/uL — ABNORMAL HIGH (ref 3.4–10.8)

## 2020-07-12 ENCOUNTER — Inpatient Hospital Stay (HOSPITAL_COMMUNITY)
Admission: AD | Admit: 2020-07-12 | Discharge: 2020-07-12 | Disposition: A | Discharge status: Home / Self Care | Payer: Medicaid Other | Attending: Obstetrics & Gynecology | Admitting: Obstetrics & Gynecology

## 2020-07-12 ENCOUNTER — Other Ambulatory Visit: Payer: Self-pay

## 2020-07-12 ENCOUNTER — Encounter (HOSPITAL_COMMUNITY): Payer: Self-pay | Admitting: Obstetrics & Gynecology

## 2020-07-12 ENCOUNTER — Ambulatory Visit (INDEPENDENT_AMBULATORY_CARE_PROVIDER_SITE_OTHER): Payer: Medicaid Other | Admitting: Obstetrics and Gynecology

## 2020-07-12 ENCOUNTER — Encounter: Payer: Self-pay | Admitting: Obstetrics and Gynecology

## 2020-07-12 VITALS — BP 149/88 | HR 99 | Wt 179.0 lb

## 2020-07-12 DIAGNOSIS — Z3A34 34 weeks gestation of pregnancy: Secondary | ICD-10-CM

## 2020-07-12 DIAGNOSIS — O163 Unspecified maternal hypertension, third trimester: Secondary | ICD-10-CM

## 2020-07-12 DIAGNOSIS — O99353 Diseases of the nervous system complicating pregnancy, third trimester: Secondary | ICD-10-CM | POA: Diagnosis not present

## 2020-07-12 DIAGNOSIS — G44209 Tension-type headache, unspecified, not intractable: Secondary | ICD-10-CM

## 2020-07-12 DIAGNOSIS — O99891 Other specified diseases and conditions complicating pregnancy: Secondary | ICD-10-CM

## 2020-07-12 DIAGNOSIS — O133 Gestational [pregnancy-induced] hypertension without significant proteinuria, third trimester: Secondary | ICD-10-CM | POA: Diagnosis not present

## 2020-07-12 DIAGNOSIS — O09299 Supervision of pregnancy with other poor reproductive or obstetric history, unspecified trimester: Secondary | ICD-10-CM

## 2020-07-12 DIAGNOSIS — R030 Elevated blood-pressure reading, without diagnosis of hypertension: Secondary | ICD-10-CM | POA: Diagnosis present

## 2020-07-12 DIAGNOSIS — O0993 Supervision of high risk pregnancy, unspecified, third trimester: Secondary | ICD-10-CM

## 2020-07-12 DIAGNOSIS — R519 Headache, unspecified: Secondary | ICD-10-CM | POA: Diagnosis not present

## 2020-07-12 DIAGNOSIS — O99519 Diseases of the respiratory system complicating pregnancy, unspecified trimester: Secondary | ICD-10-CM

## 2020-07-12 DIAGNOSIS — J45909 Unspecified asthma, uncomplicated: Secondary | ICD-10-CM

## 2020-07-12 DIAGNOSIS — Z3689 Encounter for other specified antenatal screening: Secondary | ICD-10-CM

## 2020-07-12 DIAGNOSIS — M549 Dorsalgia, unspecified: Secondary | ICD-10-CM

## 2020-07-12 LAB — CBC
HCT: 30.9 % — ABNORMAL LOW (ref 36.0–46.0)
Hemoglobin: 10.1 g/dL — ABNORMAL LOW (ref 12.0–15.0)
MCH: 28.9 pg (ref 26.0–34.0)
MCHC: 32.7 g/dL (ref 30.0–36.0)
MCV: 88.3 fL (ref 80.0–100.0)
Platelets: 236 10*3/uL (ref 150–400)
RBC: 3.5 MIL/uL — ABNORMAL LOW (ref 3.87–5.11)
RDW: 12.2 % (ref 11.5–15.5)
WBC: 12.7 10*3/uL — ABNORMAL HIGH (ref 4.0–10.5)
nRBC: 0 % (ref 0.0–0.2)

## 2020-07-12 LAB — COMPREHENSIVE METABOLIC PANEL
ALT: 10 U/L (ref 0–44)
AST: 18 U/L (ref 15–41)
Albumin: 2.8 g/dL — ABNORMAL LOW (ref 3.5–5.0)
Alkaline Phosphatase: 83 U/L (ref 38–126)
Anion gap: 7 (ref 5–15)
BUN: <5 mg/dL — ABNORMAL LOW (ref 6–20)
CO2: 22 mmol/L (ref 22–32)
Calcium: 8.9 mg/dL (ref 8.9–10.3)
Chloride: 106 mmol/L (ref 98–111)
Creatinine, Ser: 0.59 mg/dL (ref 0.44–1.00)
GFR, Estimated: >60 mL/min (ref >60)
Glucose, Bld: 124 mg/dL — ABNORMAL HIGH (ref 70–99)
Potassium: 3.4 mmol/L — ABNORMAL LOW (ref 3.5–5.1)
Sodium: 135 mmol/L (ref 135–145)
Total Bilirubin: 0.5 mg/dL (ref 0.3–1.2)
Total Protein: 6.1 g/dL — ABNORMAL LOW (ref 6.5–8.1)

## 2020-07-12 LAB — PROTEIN / CREATININE RATIO, URINE
Creatinine, Urine: 124.82 mg/dL
Protein Creatinine Ratio: 0.1 mg/mg{Cre} (ref 0.00–0.15)
Total Protein, Urine: 13 mg/dL

## 2020-07-12 MED ORDER — ACETAMINOPHEN 500 MG PO TABS: 1000.0000 mg | ORAL_TABLET | Freq: Four times a day (QID) | ORAL | Status: DC | PRN

## 2020-07-12 MED ORDER — BUTALBITAL-APAP-CAFFEINE 50-325-40 MG PO TABS
1.0000 | ORAL_TABLET | Freq: Once | ORAL | Status: AC
  Administered 2020-07-12: 1 via ORAL
  Filled 2020-07-12: qty 1

## 2020-07-12 MED ORDER — CYCLOBENZAPRINE HCL 10 MG PO TABS: 10.0000 mg | ORAL_TABLET | Freq: Three times a day (TID) | ORAL | 0 refills | Status: DC | PRN

## 2020-07-12 NOTE — MAU Note (Signed)
Victoria Mills is a 25 y.o. at 105w4d here in MAU reporting: sent over from the office for elevated BP. No visual changes but pt does report a headache. Headache started last night and pt has tried tylenol with no relief. Also reporting RUQ pain. Having some leaking ongoing for weeks, thinks it is urine.  +FM  Onset of complaint: today  Pain score: headache 4/10, RUQ 3/10  Vitals:   07/12/20 1145  BP: (!) 133/99  Pulse: 98  Resp: 16  Temp: (!) 97.5 F (36.4 C)  SpO2: 98%     FHT:140  Lab orders placed from triage: [facility]A

## 2020-07-12 NOTE — Progress Notes (Signed)
   PRENATAL VISIT NOTE  Subjective:  Daylyn Christine is a 25 y.o. [phone] at [redacted]w[redacted]d being seen today for ongoing prenatal care.  She is currently monitored for the following issues for this high-risk pregnancy and has Supervision of high-risk pregnancy; Hx of preeclampsia, prior pregnancy, currently pregnant; Asthma complicating pregnancy, antepartum; [redacted] weeks gestation of pregnancy; Elevated blood pressure affecting pregnancy in third trimester, antepartum; and Back pain affecting pregnancy in third trimester on their problem list.  Patient doing well with no acute concerns today. She reports backache and headache.  Contractions: Not present. Vag. Bleeding: None.  Movement: Present. Denies leaking of fluid.   Pt notes mild to moderate headache not responsive to tylenol.  The following portions of the patient's history were reviewed and updated as appropriate: allergies, current medications, past family history, past medical history, past social history, past surgical history and problem list. Problem list updated.  Objective:   Vitals:   07/12/20 1024 07/12/20 1032  BP: (!) 159/70 (!) 149/88  Pulse: 91 99  Weight: 179 lb (81.2 kg)     Fetal Status: Fetal Heart Rate (bpm): 140 Fundal Height: 36 cm Movement: Present     General:  Alert, oriented and cooperative. Patient is in no acute distress.  Skin: Skin is warm and dry. No rash noted.   Cardiovascular: Normal heart rate noted  Respiratory: Normal respiratory effort, no problems with respiration noted  Abdomen: Soft, gravid, appropriate for gestational age.  Pain/Pressure: Present     Pelvic: Cervical exam deferred        Extremities: Normal range of motion.     Mental Status:  Normal mood and affect. Normal behavior. Normal judgment and thought content.   Assessment and Plan:  Pregnancy: G4P2102 at [redacted]w[redacted]d  1. [redacted] weeks gestation of pregnancy   2. Asthma complicating pregnancy, antepartum   3. Hx of preeclampsia, prior pregnancy,  currently pregnant   4. Supervision of high risk pregnancy in third trimester   5. Elevated blood pressure affecting pregnancy in third trimester, antepartum Due to near severe range systolic BP , history of preeclampsia and persistent headache, will send pt to MAU for further eval  6. Back pain affecting pregnancy in third trimester  - cyclobenzaprine (FLEXERIL) 10 MG tablet; Take 1 tablet (10 mg total) by mouth 3 (three) times daily as needed for muscle spasms.  Dispense: 30 tablet; Refill: 0  Preterm labor symptoms and general obstetric precautions including but not limited to vaginal bleeding, contractions, leaking of fluid and fetal movement were reviewed in detail with the patient.  Please refer to After Visit Summary for other counseling recommendations.   Return in about 1 week (around 07/19/2020) for [facility], in person.   Mariel Aloe, MD Faculty Attending Center for [facility]

## 2020-07-12 NOTE — Progress Notes (Signed)
Pt states she is having lots of pregnancy pelvic pain/pressure. Recommend Tylenol  And maternity support belt.  States that it is hard to sleep at night.

## 2020-07-12 NOTE — Discharge Instructions (Signed)
Hypertension During Pregnancy Hypertension is also called high blood pressure. High blood pressure means that the force of the blood moving in your body is high enough to cause problems for you and your baby. Different types of high blood pressure can happen during pregnancy. The types are:  High blood pressure before you got pregnant. This is called chronic hypertension.  This can continue during your pregnancy. Your doctor will want to keep checking your blood pressure. You may need medicine to control your blood pressure while you are pregnant. You will need follow-up visits after you have your baby.  High blood pressure that goes up during pregnancy when it was normal before. This is called gestational hypertension. It will often get better after you have your baby, but your doctor will need to watch your blood pressure to make sure that it is getting better.  You may develop high blood pressure after giving birth. This is called postpartum hypertension. This often occurs within 48 hours after childbirth but may occur up to 6 weeks after giving birth. Very high blood pressure during pregnancy is an emergency that needs treatment right away. How does this affect me? If you have high blood pressure during pregnancy, you have a higher chance of developing high blood pressure:  As you get older.  If you get pregnant again. In some cases, high blood pressure during pregnancy can cause:  Stroke.  Heart attack.  Damage to the kidneys, lungs, or liver.  Preeclampsia.  HELLP syndrome.  Seizures.  Problems with the placenta. How does this affect my baby? Your baby may:  Be born early.  Not weigh as much as he or she should.  Not handle labor well, leading to a C-section. This condition may also result in a baby's death before birth (stillbirth). What are the risks?  Having high blood pressure during a past pregnancy.  Being overweight.  Being age 35 or older.  Being pregnant  for the first time.  Being pregnant with more than one baby.  Becoming pregnant using fertility methods, such as IVF.  Having other problems, such as diabetes or kidney disease. What can I do to lower my risk?  Keep a healthy weight.  Eat a healthy diet.  Follow what your doctor tells you about treating any medical problems that you had before you got pregnant. It is very important to go to all of your doctor visits. Your doctor will check your blood pressure and make sure that your pregnancy is progressing as it should. Treatment should start early if a problem is found.   How is this treated? Treatment for high blood pressure during pregnancy can vary. It depends on the type of high blood pressure you have and how serious it is.  If you were taking medicine for your blood pressure before you got pregnant, talk with your doctor. You may need to change the medicine during pregnancy if it is not safe for your baby.  If your blood pressure goes up during pregnancy, your doctor may order medicine to treat this.  If you are at risk for preeclampsia, your doctor may tell you to take a low-dose aspirin while you are pregnant.  If you have very high blood pressure, you may need to stay in the hospital so you and your baby can be watched closely. You may also need to take medicine to lower your blood pressure.  In some cases, if your condition gets worse, you may need to have your baby early.   Follow these instructions at home: Eating and drinking  Drink enough fluid to keep your pee (urine) pale yellow.  Avoid caffeine.   Lifestyle  Do not smoke or use any products that contain nicotine or tobacco. If you need help quitting, ask your doctor.  Do not use alcohol or drugs.  Avoid stress.  Rest and get plenty of sleep.  Regular exercise can help. Ask your doctor what kinds of exercise are best for you. General instructions  Take over-the-counter and prescription medicines only as  told by your doctor.  Keep all prenatal and follow-up visits. Contact a doctor if:  You have symptoms that your doctor told you to watch for, such as: ? Headaches. ? A feeling like you may vomit (nausea). ? Vomiting. ? Belly (abdominal) pain. ? Feeling dizzy or light-headed. Get help right away if:  You have symptoms of serious problems, such as: ? Very bad belly pain that does not get better with treatment. ? A very bad headache that does not get better. ? Blurry vision. ? Double vision. ? Vomiting that does not get better. ? Sudden, fast weight gain. ? Sudden swelling in your hands, ankles, or face. ? Bleeding from your vagina. ? Blood in your pee. ? Shortness of breath. ? Chest pain. ? Weakness on one side of your body. ? Trouble talking.  Your baby is not moving as much as usual. These symptoms may be an emergency. Get help right away. Call your local emergency services (911 in the [place]  Do not wait to see if the symptoms will go away.  Do not drive yourself to the hospital. Summary  High blood pressure is also called hypertension.  High blood pressure means that the force of the blood moving in your body is high enough to cause problems for you and your baby.  Get help right away if you have symptoms of serious problems due to high blood pressure.  Keep all prenatal and follow-up visits. This information is not intended to replace advice given to you by your health care provider. Make sure you discuss any questions you have with your health care provider. Document Revised: 12/17/2019 Document Reviewed: 12/17/2019 Elsevier Patient Education  2021 [employer].  

## 2020-07-12 NOTE — MAU Provider Note (Signed)
History     CSN: [id number]  Arrival date and time: 07/12/20 1128   Event Date/Time   First Provider Initiated Contact with Patient 07/12/20 1220      Chief Complaint  Patient presents with  . Abdominal Pain  . Headache  . Hypertension   25 y.o. [id number] @34 .4 wks sent from office for elevated BP. Reports onset of frontal and temporal HA last night. She took Tylenol but it didn't help. Rates pain 5/10. Denies HA, visual disturbances, RUQ pain, SOB, and CP. Reports good FM. No pregnancy complaints. Hx of pre-e in previous pregnancies.    OB History    Gravida  4   Para  3   Term  2   Preterm  1   AB      Living  2     SAB      IAB      Ectopic      Multiple  0   Live Births  3        Obstetric Comments  2015- induced at 29 wks for preeclampsia. Baby died at 51 months d/t hypoplastic lung        Past Medical History:  Diagnosis Date  . Asthma    last inhaler use 07/23/2018  . Environmental allergies   . History of pre-eclampsia in prior pregnancy, currently pregnant   . Preeclampsia 2017    Past Surgical History:  Procedure Laterality Date  . HIP ARTHROSCOPY    . WISDOM TOOTH EXTRACTION      Family History  Problem Relation Age of Onset  . Birth defects Son        hypoplastic lungs    Social History   Tobacco Use  . Smoking status: Never Smoker  . Smokeless tobacco: Never Used  Vaping Use  . Vaping Use: Never used  Substance Use Topics  . Alcohol use: Not Currently    Comment: last drink 12/11/19  . Drug use: No    Allergies: No Known Allergies  No medications prior to admission.    Review of Systems  Eyes: Negative for visual disturbance.  Respiratory: Negative for shortness of breath.   Cardiovascular: Negative for chest pain.  Gastrointestinal: Negative for abdominal pain.  Neurological: Positive for headaches.   Physical Exam   Blood pressure 120/78, pulse 98, temperature (!) 97.5 F (36.4 C), temperature source Oral,  resp. rate 18, last menstrual period 11/13/2019, SpO2 98 %, unknown if currently breastfeeding. Patient Vitals for the past 24 hrs:  BP Temp Temp src Pulse Resp SpO2  07/12/20 1430 120/78 -- -- 98 18 --  07/12/20 1401 123/69 -- -- 87 18 --  07/12/20 1346 97/70 -- -- 96 18 --  07/12/20 1332 129/69 -- -- 86 18 --  07/12/20 1316 (!) 98/47 -- -- 85 -- --  07/12/20 1301 129/66 -- -- 89 -- --  07/12/20 1246 128/77 -- -- 96 -- --  07/12/20 1245 -- -- -- -- -- 98 %  07/12/20 1240 -- -- -- -- -- 98 %  07/12/20 1235 -- -- -- -- -- 98 %  07/12/20 1231 (!) 139/113 -- -- 96 -- --  07/12/20 1230 -- -- -- -- -- 98 %  07/12/20 1225 -- -- -- -- -- 97 %  07/12/20 1220 -- -- -- -- -- 97 %  07/12/20 1216 125/68 -- -- 98 -- --  07/12/20 1215 -- -- -- -- -- 98 %  07/12/20 1210 -- -- -- -- -- 98 %  07/12/20 1208 126/75 -- -- (!) 105 -- --  07/12/20 1205 -- -- -- -- -- 99 %  07/12/20 1145 (!) 133/99 (!) 97.5 F (36.4 C) Oral 98 16 98 %   Physical Exam Vitals and nursing note reviewed.  Constitutional:      General: She is not in acute distress.    Appearance: Normal appearance.  HENT:     Head: Normocephalic and atraumatic.  Cardiovascular:     Rate and Rhythm: Normal rate.  Pulmonary:     Effort: Pulmonary effort is normal. No respiratory distress.  Musculoskeletal:        General: No swelling. Normal range of motion.     Cervical back: Normal range of motion.  Skin:    General: Skin is warm and dry.  Neurological:     General: No focal deficit present.     Mental Status: She is alert and oriented to person, place, and time.  Psychiatric:        Mood and Affect: Mood normal.        Behavior: Behavior normal.   EFM: 140 bpm, mod variability, + accels, no decels Toco: rare  Results for orders placed or performed during the hospital encounter of 07/12/20 (from the past 24 hour(s))  CBC     Status: Abnormal   Collection Time: 07/12/20 11:47 AM  Result Value Ref Range   WBC 12.7 (H) 4.0 -  10.5 K/uL   RBC 3.50 (L) 3.87 - 5.11 MIL/uL   Hemoglobin 10.1 (L) 12.0 - 15.0 g/dL   HCT 30.9 (L) 36.0 - 46.0 %   MCV 88.3 80.0 - 100.0 fL   MCH 28.9 26.0 - 34.0 pg   MCHC 32.7 30.0 - 36.0 g/dL   RDW 12.2 11.5 - 15.5 %   Platelets 236 150 - 400 K/uL   nRBC 0.0 0.0 - 0.2 %  Comprehensive metabolic panel     Status: Abnormal   Collection Time: 07/12/20 11:47 AM  Result Value Ref Range   Sodium 135 135 - 145 mmol/L   Potassium 3.4 (L) 3.5 - 5.1 mmol/L   Chloride 106 98 - 111 mmol/L   CO2 22 22 - 32 mmol/L   Glucose, Bld 124 (H) 70 - 99 mg/dL   BUN <5 (L) 6 - 20 mg/dL   Creatinine, Ser 0.59 0.44 - 1.00 mg/dL   Calcium 8.9 8.9 - 10.3 mg/dL   Total Protein 6.1 (L) 6.5 - 8.1 g/dL   Albumin 2.8 (L) 3.5 - 5.0 g/dL   AST 18 15 - 41 U/L   ALT 10 0 - 44 U/L   Alkaline Phosphatase 83 38 - 126 U/L   Total Bilirubin 0.5 0.3 - 1.2 mg/dL   GFR, Estimated >60 >60 mL/min   Anion gap 7 5 - 15  Protein / creatinine ratio, urine     Status: None   Collection Time: 07/12/20 12:48 PM  Result Value Ref Range   Creatinine, Urine 124.82 mg/dL   Total Protein, Urine 13 mg/dL   Protein Creatinine Ratio 0.10 0.00 - 0.15 mg/mg[Cre]   MAU Course  Procedures Fioricet  MDM Labs ordered and reviewed. HA resolved. No signs of PEC. Stable for discharge home.   Assessment and Plan   1. [redacted] weeks gestation of pregnancy   2. NST (non-stress test) reactive   3. Acute non intractable tension-type headache   4. Transient hypertension of pregnancy in third trimester    Discharge home Follow up at [facility] next week  PEC precautions  Allergies as of 07/12/2020   No Known Allergies     Medication List    TAKE these medications   albuterol (2.5 MG/3ML) 0.083% nebulizer solution Commonly known as: PROVENTIL Inhale 3 mLs (2.5 mg total) into the lungs every 4 (four) hours as needed for wheezing or shortness of breath.   aspirin 81 MG chewable tablet Chew 1 tablet (81 mg total) by mouth daily.   Blood  Pressure Kit Devi 1 kit by Does not apply route once a week.   budesonide-formoterol 160-4.5 MCG/ACT inhaler Commonly known as: Symbicort Inhale 2 puffs into the lungs 2 (two) times daily.   cyclobenzaprine 10 MG tablet Commonly known as: FLEXERIL Take 1 tablet (10 mg total) by mouth 3 (three) times daily as needed for muscle spasms.   ferrous sulfate 325 (65 FE) MG tablet Take 1 tablet (325 mg total) by mouth every other day.   prenatal multivitamin Tabs tablet Take 1 tablet by mouth daily at 12 noon.      Julianne Handler, CNM 07/12/2020, 2:45 PM

## 2020-07-19 ENCOUNTER — Other Ambulatory Visit: Payer: Self-pay

## 2020-07-19 ENCOUNTER — Ambulatory Visit (INDEPENDENT_AMBULATORY_CARE_PROVIDER_SITE_OTHER): Payer: Medicaid Other | Admitting: Obstetrics and Gynecology

## 2020-07-19 ENCOUNTER — Encounter: Payer: Self-pay | Admitting: Obstetrics and Gynecology

## 2020-07-19 VITALS — BP 123/85 | HR 88 | Wt 177.0 lb

## 2020-07-19 DIAGNOSIS — O09299 Supervision of pregnancy with other poor reproductive or obstetric history, unspecified trimester: Secondary | ICD-10-CM

## 2020-07-19 DIAGNOSIS — O133 Gestational [pregnancy-induced] hypertension without significant proteinuria, third trimester: Secondary | ICD-10-CM

## 2020-07-19 DIAGNOSIS — O99810 Abnormal glucose complicating pregnancy: Secondary | ICD-10-CM

## 2020-07-19 DIAGNOSIS — O139 Gestational [pregnancy-induced] hypertension without significant proteinuria, unspecified trimester: Secondary | ICD-10-CM | POA: Insufficient documentation

## 2020-07-19 DIAGNOSIS — O0993 Supervision of high risk pregnancy, unspecified, third trimester: Secondary | ICD-10-CM

## 2020-07-19 NOTE — Patient Instructions (Signed)
Hypertension During Pregnancy Hypertension is also called high blood pressure. High blood pressure means that the force of the blood moving in your body is high enough to cause problems for you and your baby. Different types of high blood pressure can happen during pregnancy. The types are:  High blood pressure before you got pregnant. This is called chronic hypertension.  This can continue during your pregnancy. Your doctor will want to keep checking your blood pressure. You may need medicine to control your blood pressure while you are pregnant. You will need follow-up visits after you have your baby.  High blood pressure that goes up during pregnancy when it was normal before. This is called gestational hypertension. It will often get better after you have your baby, but your doctor will need to watch your blood pressure to make sure that it is getting better.  You may develop high blood pressure after giving birth. This is called postpartum hypertension. This often occurs within 48 hours after childbirth but may occur up to 6 weeks after giving birth. Very high blood pressure during pregnancy is an emergency that needs treatment right away. How does this affect me? If you have high blood pressure during pregnancy, you have a higher chance of developing high blood pressure:  As you get older.  If you get pregnant again. In some cases, high blood pressure during pregnancy can cause:  Stroke.  Heart attack.  Damage to the kidneys, lungs, or liver.  Preeclampsia.  HELLP syndrome.  Seizures.  Problems with the placenta. How does this affect my baby? Your baby may:  Be born early.  Not weigh as much as he or she should.  Not handle labor well, leading to a C-section. This condition may also result in a baby's death before birth (stillbirth). What are the risks?  Having high blood pressure during a past pregnancy.  Being overweight.  Being age 35 or older.  Being pregnant  for the first time.  Being pregnant with more than one baby.  Becoming pregnant using fertility methods, such as IVF.  Having other problems, such as diabetes or kidney disease. What can I do to lower my risk?  Keep a healthy weight.  Eat a healthy diet.  Follow what your doctor tells you about treating any medical problems that you had before you got pregnant. It is very important to go to all of your doctor visits. Your doctor will check your blood pressure and make sure that your pregnancy is progressing as it should. Treatment should start early if a problem is found.   How is this treated? Treatment for high blood pressure during pregnancy can vary. It depends on the type of high blood pressure you have and how serious it is.  If you were taking medicine for your blood pressure before you got pregnant, talk with your doctor. You may need to change the medicine during pregnancy if it is not safe for your baby.  If your blood pressure goes up during pregnancy, your doctor may order medicine to treat this.  If you are at risk for preeclampsia, your doctor may tell you to take a low-dose aspirin while you are pregnant.  If you have very high blood pressure, you may need to stay in the hospital so you and your baby can be watched closely. You may also need to take medicine to lower your blood pressure.  In some cases, if your condition gets worse, you may need to have your baby early.   Follow these instructions at home: Eating and drinking  Drink enough fluid to keep your pee (urine) pale yellow.  Avoid caffeine.   Lifestyle  Do not smoke or use any products that contain nicotine or tobacco. If you need help quitting, ask your doctor.  Do not use alcohol or drugs.  Avoid stress.  Rest and get plenty of sleep.  Regular exercise can help. Ask your doctor what kinds of exercise are best for you. General instructions  Take over-the-counter and prescription medicines only as  told by your doctor.  Keep all prenatal and follow-up visits. Contact a doctor if:  You have symptoms that your doctor told you to watch for, such as: ? Headaches. ? A feeling like you may vomit (nausea). ? Vomiting. ? Belly (abdominal) pain. ? Feeling dizzy or light-headed. Get help right away if:  You have symptoms of serious problems, such as: ? Very bad belly pain that does not get better with treatment. ? A very bad headache that does not get better. ? Blurry vision. ? Double vision. ? Vomiting that does not get better. ? Sudden, fast weight gain. ? Sudden swelling in your hands, ankles, or face. ? Bleeding from your vagina. ? Blood in your pee. ? Shortness of breath. ? Chest pain. ? Weakness on one side of your body. ? Trouble talking.  Your baby is not moving as much as usual. These symptoms may be an emergency. Get help right away. Call your local emergency services (911 in the [place]  Do not wait to see if the symptoms will go away.  Do not drive yourself to the hospital. Summary  High blood pressure is also called hypertension.  High blood pressure means that the force of the blood moving in your body is high enough to cause problems for you and your baby.  Get help right away if you have symptoms of serious problems due to high blood pressure.  Keep all prenatal and follow-up visits. This information is not intended to replace advice given to you by your health care provider. Make sure you discuss any questions you have with your health care provider. Document Revised: 12/17/2019 Document Reviewed: 12/17/2019 Elsevier Patient Education  2021 [employer]vier [employer].  

## 2020-07-19 NOTE — Progress Notes (Signed)
+   Fetal movement. No complaints.  

## 2020-07-19 NOTE — Progress Notes (Signed)
Subjective:  Victoria Mills is a 25 y.o. [phone] at [redacted]w[redacted]d being seen today for ongoing prenatal care.  She is currently monitored for the following issues for this high-risk pregnancy and has Supervision of high-risk pregnancy; Hx of preeclampsia, prior pregnancy, currently pregnant; Asthma complicating pregnancy, antepartum; Back pain affecting pregnancy in third trimester; and Gestational hyperglycemia on their problem list.  Patient reports allergies, occ HA ( resolves with Flexeril), no visual changes. BP at home < 140/90 per pt.  Contractions: Not present. Vag. Bleeding: None.  Movement: Present. Denies leaking of fluid.   The following portions of the patient's history were reviewed and updated as appropriate: allergies, current medications, past family history, past medical history, past social history, past surgical history and problem list. Problem list updated.  Objective:   Vitals:   07/19/20 1013  BP: 123/85  Pulse: 88  Weight: 177 lb (80.3 kg)    Fetal Status: Fetal Heart Rate (bpm): 140   Movement: Present     General:  Alert, oriented and cooperative. Patient is in no acute distress.  Skin: Skin is warm and dry. No rash noted.   Cardiovascular: Normal heart rate noted  Respiratory: Normal respiratory effort, no problems with respiration noted  Abdomen: Soft, gravid, appropriate for gestational age. Pain/Pressure: Present     Pelvic:  Cervical exam deferred        Extremities: Normal range of motion.  Edema: None  Mental Status: Normal mood and affect. Normal behavior. Normal judgment and thought content.   Urinalysis:      Assessment and Plan:  Pregnancy: [id number] at [redacted]w[redacted]d  1. Supervision of high risk pregnancy in third trimester Stable GBS next vist  2. Hx of preeclampsia, prior pregnancy, currently pregnant See below  3. Gestational hypertension Reviewed with pt. Indications to present to hospital reviewed Continue to monitor BP at home Growth scan  tomorrow Consider delivery between 37-39 weeks  Preterm labor symptoms and general obstetric precautions including but not limited to vaginal bleeding, contractions, leaking of fluid and fetal movement were reviewed in detail with the patient. Please refer to After Visit Summary for other counseling recommendations.  Return in about 1 week (around 07/26/2020) for OB visit, face to face, MD only.   Hermina Staggers, MD

## 2020-07-20 ENCOUNTER — Ambulatory Visit: Payer: Medicaid Other | Attending: Maternal & Fetal Medicine

## 2020-07-20 ENCOUNTER — Encounter: Payer: Self-pay | Admitting: *Deleted

## 2020-07-20 ENCOUNTER — Ambulatory Visit: Payer: Medicaid Other | Admitting: *Deleted

## 2020-07-20 VITALS — BP 131/78 | HR 98

## 2020-07-20 DIAGNOSIS — O09219 Supervision of pregnancy with history of pre-term labor, unspecified trimester: Secondary | ICD-10-CM

## 2020-07-20 DIAGNOSIS — Z362 Encounter for other antenatal screening follow-up: Secondary | ICD-10-CM

## 2020-07-25 ENCOUNTER — Other Ambulatory Visit (HOSPITAL_COMMUNITY)
Admission: RE | Admit: 2020-07-25 | Discharge: 2020-07-25 | Disposition: A | Discharge status: Home / Self Care | Payer: Medicaid Other | Source: Ambulatory Visit | Attending: Obstetrics and Gynecology | Admitting: Obstetrics and Gynecology

## 2020-07-25 ENCOUNTER — Encounter (HOSPITAL_COMMUNITY): Payer: Self-pay | Admitting: *Deleted

## 2020-07-25 ENCOUNTER — Other Ambulatory Visit: Payer: Self-pay

## 2020-07-25 ENCOUNTER — Telehealth (HOSPITAL_COMMUNITY): Payer: Self-pay | Admitting: *Deleted

## 2020-07-25 ENCOUNTER — Ambulatory Visit (INDEPENDENT_AMBULATORY_CARE_PROVIDER_SITE_OTHER): Payer: Medicaid Other | Admitting: Obstetrics and Gynecology

## 2020-07-25 VITALS — BP 136/97 | HR 96 | Wt 183.0 lb

## 2020-07-25 DIAGNOSIS — O09299 Supervision of pregnancy with other poor reproductive or obstetric history, unspecified trimester: Secondary | ICD-10-CM

## 2020-07-25 DIAGNOSIS — Z3A36 36 weeks gestation of pregnancy: Secondary | ICD-10-CM

## 2020-07-25 DIAGNOSIS — O0993 Supervision of high risk pregnancy, unspecified, third trimester: Secondary | ICD-10-CM | POA: Insufficient documentation

## 2020-07-25 DIAGNOSIS — O133 Gestational [pregnancy-induced] hypertension without significant proteinuria, third trimester: Secondary | ICD-10-CM

## 2020-07-25 NOTE — Progress Notes (Signed)
   PRENATAL VISIT NOTE  Subjective:  Victoria Mills is a 25 y.o. [phone] at 109w3d being seen today for ongoing prenatal care.  She is currently monitored for the following issues for this high-risk pregnancy and has Supervision of high-risk pregnancy; Hx of preeclampsia, prior pregnancy, currently pregnant; Asthma complicating pregnancy, antepartum; Back pain affecting pregnancy in third trimester; Gestational hypertension; and [redacted] weeks gestation of pregnancy on their problem list.  Patient doing well with no acute concerns today. She reports no complaints.  Contractions: Not present. Vag. Bleeding: None.  Movement: Present. Denies leaking of fluid.   The following portions of the patient's history were reviewed and updated as appropriate: allergies, current medications, past family history, past medical history, past social history, past surgical history and problem list. Problem list updated.  Objective:   Vitals:   07/25/20 1131 07/25/20 1132  BP: (!) 153/122 (!) 136/97  Pulse: 96 96  Weight: 183 lb (83 kg)     Fetal Status: Fetal Heart Rate (bpm): 136   Movement: Present  Presentation: Vertex  General:  Alert, oriented and cooperative. Patient is in no acute distress.  Skin: Skin is warm and dry. No rash noted.   Cardiovascular: Normal heart rate noted  Respiratory: Normal respiratory effort, no problems with respiration noted  Abdomen: Soft, gravid, appropriate for gestational age.  Pain/Pressure: Present     Pelvic: Cervical exam performed Dilation: Fingertip Effacement (%): 60 Station: -2  Extremities: Normal range of motion.  Edema: None  Mental Status:  Normal mood and affect. Normal behavior. Normal judgment and thought content.  Bedside u/s showed vertex position Assessment and Plan:  Pregnancy: [id number] at [redacted]w[redacted]d  1. Gestational hypertension, third trimester BP remains elevated, no overt signs of preeclampsia, no HA, visual changes or RUQ pain Will induce at 37 weeks - US  FETAL BPP W/NONSTRESS; Future  2. Supervision of high risk pregnancy in third trimester  - Strep Gp B NAA - Cervicovaginal ancillary only( [place])  3. Hx of preeclampsia, prior pregnancy, currently pregnant   4. [redacted] weeks gestation of pregnancy   Term labor symptoms and general obstetric precautions including but not limited to vaginal bleeding, contractions, leaking of fluid and fetal movement were reviewed in detail with the patient.  Please refer to After Visit Summary for other counseling recommendations.   No follow-ups on file.   Mariel Aloe, MD Faculty Attending Center for [facility]

## 2020-07-25 NOTE — Telephone Encounter (Signed)
Preadmission screen  

## 2020-07-26 ENCOUNTER — Ambulatory Visit: Payer: Medicaid Other | Attending: Obstetrics and Gynecology

## 2020-07-26 ENCOUNTER — Ambulatory Visit: Payer: Medicaid Other | Admitting: *Deleted

## 2020-07-26 ENCOUNTER — Encounter: Payer: Self-pay | Admitting: *Deleted

## 2020-07-26 DIAGNOSIS — O0993 Supervision of high risk pregnancy, unspecified, third trimester: Secondary | ICD-10-CM | POA: Diagnosis present

## 2020-07-26 DIAGNOSIS — O133 Gestational [pregnancy-induced] hypertension without significant proteinuria, third trimester: Secondary | ICD-10-CM | POA: Diagnosis present

## 2020-07-26 LAB — CERVICOVAGINAL ANCILLARY ONLY
Chlamydia: NEGATIVE
Comment: NEGATIVE
Comment: NORMAL
Neisseria Gonorrhea: NEGATIVE

## 2020-07-27 ENCOUNTER — Other Ambulatory Visit (HOSPITAL_COMMUNITY)
Admission: RE | Admit: 2020-07-27 | Discharge: 2020-07-27 | Disposition: A | Discharge status: Home / Self Care | Payer: Medicaid Other | Source: Ambulatory Visit | Attending: Family Medicine | Admitting: Family Medicine

## 2020-07-27 DIAGNOSIS — Z01812 Encounter for preprocedural laboratory examination: Secondary | ICD-10-CM | POA: Diagnosis present

## 2020-07-27 DIAGNOSIS — Z20822 Contact with and (suspected) exposure to covid-19: Secondary | ICD-10-CM | POA: Diagnosis not present

## 2020-07-27 LAB — STREP GP B NAA: Strep Gp B NAA: NEGATIVE

## 2020-07-27 LAB — SARS CORONAVIRUS 2 (TAT 6-24 HRS): SARS Coronavirus 2: NEGATIVE

## 2020-07-28 ENCOUNTER — Other Ambulatory Visit: Payer: Self-pay | Admitting: Family Medicine

## 2020-07-29 ENCOUNTER — Inpatient Hospital Stay (HOSPITAL_COMMUNITY): Payer: Medicaid Other

## 2020-07-29 ENCOUNTER — Inpatient Hospital Stay (HOSPITAL_COMMUNITY)
Admission: AD | Admit: 2020-07-29 | Discharge: 2020-07-31 | DRG: 807 | Disposition: A | Discharge status: Home / Self Care | Payer: Medicaid Other | Attending: Obstetrics and Gynecology | Admitting: Obstetrics and Gynecology

## 2020-07-29 ENCOUNTER — Encounter (HOSPITAL_COMMUNITY): Payer: Self-pay | Admitting: Obstetrics and Gynecology

## 2020-07-29 ENCOUNTER — Inpatient Hospital Stay (HOSPITAL_COMMUNITY): Payer: Medicaid Other | Admitting: Anesthesiology

## 2020-07-29 DIAGNOSIS — Z3A37 37 weeks gestation of pregnancy: Secondary | ICD-10-CM

## 2020-07-29 DIAGNOSIS — J45909 Unspecified asthma, uncomplicated: Secondary | ICD-10-CM | POA: Diagnosis present

## 2020-07-29 DIAGNOSIS — Z8759 Personal history of other complications of pregnancy, childbirth and the puerperium: Secondary | ICD-10-CM | POA: Diagnosis present

## 2020-07-29 DIAGNOSIS — O134 Gestational [pregnancy-induced] hypertension without significant proteinuria, complicating childbirth: Principal | ICD-10-CM | POA: Diagnosis present

## 2020-07-29 DIAGNOSIS — O9902 Anemia complicating childbirth: Secondary | ICD-10-CM | POA: Diagnosis present

## 2020-07-29 DIAGNOSIS — O139 Gestational [pregnancy-induced] hypertension without significant proteinuria, unspecified trimester: Secondary | ICD-10-CM | POA: Diagnosis present

## 2020-07-29 DIAGNOSIS — Z30017 Encounter for initial prescription of implantable subdermal contraceptive: Secondary | ICD-10-CM | POA: Diagnosis not present

## 2020-07-29 DIAGNOSIS — O0990 Supervision of high risk pregnancy, unspecified, unspecified trimester: Secondary | ICD-10-CM

## 2020-07-29 DIAGNOSIS — O99519 Diseases of the respiratory system complicating pregnancy, unspecified trimester: Secondary | ICD-10-CM | POA: Diagnosis present

## 2020-07-29 DIAGNOSIS — D509 Iron deficiency anemia, unspecified: Secondary | ICD-10-CM | POA: Diagnosis present

## 2020-07-29 DIAGNOSIS — O365931 Maternal care for other known or suspected poor fetal growth, third trimester, fetus 1: Secondary | ICD-10-CM | POA: Diagnosis present

## 2020-07-29 DIAGNOSIS — O133 Gestational [pregnancy-induced] hypertension without significant proteinuria, third trimester: Secondary | ICD-10-CM | POA: Diagnosis present

## 2020-07-29 DIAGNOSIS — O9952 Diseases of the respiratory system complicating childbirth: Secondary | ICD-10-CM | POA: Diagnosis present

## 2020-07-29 DIAGNOSIS — O0993 Supervision of high risk pregnancy, unspecified, third trimester: Secondary | ICD-10-CM

## 2020-07-29 DIAGNOSIS — O09299 Supervision of pregnancy with other poor reproductive or obstetric history, unspecified trimester: Secondary | ICD-10-CM

## 2020-07-29 LAB — COMPREHENSIVE METABOLIC PANEL
ALT: 12 U/L (ref 0–44)
AST: 21 U/L (ref 15–41)
Albumin: 2.7 g/dL — ABNORMAL LOW (ref 3.5–5.0)
Alkaline Phosphatase: 105 U/L (ref 38–126)
Anion gap: 7 (ref 5–15)
BUN: 7 mg/dL (ref 6–20)
CO2: 22 mmol/L (ref 22–32)
Calcium: 8.9 mg/dL (ref 8.9–10.3)
Chloride: 106 mmol/L (ref 98–111)
Creatinine, Ser: 0.51 mg/dL (ref 0.44–1.00)
GFR, Estimated: >60 mL/min (ref >60)
Glucose, Bld: 114 mg/dL — ABNORMAL HIGH (ref 70–99)
Potassium: 3.5 mmol/L (ref 3.5–5.1)
Sodium: 135 mmol/L (ref 135–145)
Total Bilirubin: 0.4 mg/dL (ref 0.3–1.2)
Total Protein: 5.9 g/dL — ABNORMAL LOW (ref 6.5–8.1)

## 2020-07-29 LAB — CBC
HCT: 30.1 % — ABNORMAL LOW (ref 36.0–46.0)
Hemoglobin: 9.5 g/dL — ABNORMAL LOW (ref 12.0–15.0)
MCH: 27.1 pg (ref 26.0–34.0)
MCHC: 31.6 g/dL (ref 30.0–36.0)
MCV: 85.8 fL (ref 80.0–100.0)
Platelets: 237 10*3/uL (ref 150–400)
RBC: 3.51 MIL/uL — ABNORMAL LOW (ref 3.87–5.11)
RDW: 12.6 % (ref 11.5–15.5)
WBC: 11.5 10*3/uL — ABNORMAL HIGH (ref 4.0–10.5)
nRBC: 0 % (ref 0.0–0.2)

## 2020-07-29 LAB — WET PREP, GENITAL
Clue Cells Wet Prep HPF POC: NONE SEEN
Sperm: NONE SEEN
Trich, Wet Prep: NONE SEEN
Yeast Wet Prep HPF POC: NONE SEEN

## 2020-07-29 LAB — PROTEIN / CREATININE RATIO, URINE
Creatinine, Urine: 71.68 mg/dL
Protein Creatinine Ratio: 0.14 mg/mg{Cre} (ref 0.00–0.15)
Total Protein, Urine: 10 mg/dL

## 2020-07-29 LAB — TYPE AND SCREEN
ABO/RH(D): O POS
Antibody Screen: NEGATIVE

## 2020-07-29 LAB — RPR: RPR Ser Ql: NONREACTIVE

## 2020-07-29 MED ORDER — MISOPROSTOL 25 MCG QUARTER TABLET
25.0000 ug | ORAL_TABLET | ORAL | Status: DC | PRN
  Filled 2020-07-29: qty 1

## 2020-07-29 MED ORDER — FENTANYL-BUPIVACAINE-NACL 0.5-0.125-0.9 MG/250ML-% EP SOLN
EPIDURAL | Status: AC
  Filled 2020-07-29: qty 250

## 2020-07-29 MED ORDER — LACTATED RINGERS IV SOLN: 500.0000 mL | Freq: Once | INTRAVENOUS | Status: DC

## 2020-07-29 MED ORDER — MISOPROSTOL 50MCG HALF TABLET
50.0000 ug | ORAL_TABLET | Freq: Once | ORAL | Status: AC
  Administered 2020-07-29: 50 ug via BUCCAL
  Filled 2020-07-29: qty 1

## 2020-07-29 MED ORDER — SODIUM CHLORIDE 0.9 % IV SOLN: 5.0000 10*6.[IU] | Freq: Once | INTRAVENOUS | Status: DC

## 2020-07-29 MED ORDER — FENTANYL CITRATE (PF) 100 MCG/2ML IJ SOLN
50.0000 ug | INTRAMUSCULAR | Status: DC | PRN
  Administered 2020-07-29: 50 ug via INTRAVENOUS
  Filled 2020-07-29: qty 2

## 2020-07-29 MED ORDER — EPHEDRINE 5 MG/ML INJ: 10.0000 mg | INTRAVENOUS | Status: DC | PRN

## 2020-07-29 MED ORDER — LIDOCAINE HCL (PF) 1 % IJ SOLN
INTRAMUSCULAR | Status: DC | PRN
  Administered 2020-07-29 (×2): 4 mL via EPIDURAL

## 2020-07-29 MED ORDER — LACTATED RINGERS IV SOLN
500.0000 mL | INTRAVENOUS | Status: DC | PRN
Start: 2020-07-29 — End: 2020-07-30

## 2020-07-29 MED ORDER — PENICILLIN G POT IN DEXTROSE 60000 UNIT/ML IV SOLN: 3.0000 10*6.[IU] | INTRAVENOUS | Status: DC

## 2020-07-29 MED ORDER — HYDROXYZINE HCL 50 MG PO TABS
50.0000 mg | ORAL_TABLET | Freq: Four times a day (QID) | ORAL | Status: DC | PRN
Start: 2020-07-29 — End: 2020-07-30

## 2020-07-29 MED ORDER — OXYCODONE-ACETAMINOPHEN 5-325 MG PO TABS: 1.0000 | ORAL_TABLET | ORAL | Status: DC | PRN

## 2020-07-29 MED ORDER — DIPHENHYDRAMINE HCL 50 MG/ML IJ SOLN: 12.5000 mg | INTRAMUSCULAR | Status: DC | PRN

## 2020-07-29 MED ORDER — FENTANYL-BUPIVACAINE-NACL 0.5-0.125-0.9 MG/250ML-% EP SOLN
12.0000 mL/h | EPIDURAL | Status: DC | PRN
  Administered 2020-07-29: 12 mL/h via EPIDURAL

## 2020-07-29 MED ORDER — LIDOCAINE HCL (PF) 1 % IJ SOLN: 30.0000 mL | INTRAMUSCULAR | Status: DC | PRN

## 2020-07-29 MED ORDER — LACTATED RINGERS IV SOLN: INTRAVENOUS | Status: DC

## 2020-07-29 MED ORDER — ACETAMINOPHEN 325 MG PO TABS: 650.0000 mg | ORAL_TABLET | ORAL | Status: DC | PRN

## 2020-07-29 MED ORDER — TERBUTALINE SULFATE 1 MG/ML IJ SOLN: 0.2500 mg | Freq: Once | INTRAMUSCULAR | Status: DC | PRN

## 2020-07-29 MED ORDER — SOD CITRATE-CITRIC ACID 500-334 MG/5ML PO SOLN: 30.0000 mL | ORAL | Status: DC | PRN

## 2020-07-29 MED ORDER — OXYTOCIN-SODIUM CHLORIDE 30-0.9 UT/500ML-% IV SOLN: 1.0000 m[IU]/min | INTRAVENOUS | Status: DC

## 2020-07-29 MED ORDER — ONDANSETRON HCL 4 MG/2ML IJ SOLN
4.0000 mg | Freq: Four times a day (QID) | INTRAMUSCULAR | Status: DC | PRN
  Administered 2020-07-29: 4 mg via INTRAVENOUS
  Filled 2020-07-29: qty 2

## 2020-07-29 MED ORDER — PHENYLEPHRINE 40 MCG/ML (10ML) SYRINGE FOR IV PUSH (FOR BLOOD PRESSURE SUPPORT)
80.0000 ug | PREFILLED_SYRINGE | INTRAVENOUS | Status: DC | PRN
  Filled 2020-07-29: qty 10

## 2020-07-29 MED ORDER — OXYTOCIN BOLUS FROM INFUSION
333.0000 mL | Freq: Once | INTRAVENOUS | Status: AC
  Administered 2020-07-30: 333 mL via INTRAVENOUS

## 2020-07-29 MED ORDER — OXYTOCIN-SODIUM CHLORIDE 30-0.9 UT/500ML-% IV SOLN
2.5000 [IU]/h | INTRAVENOUS | Status: DC
  Filled 2020-07-29: qty 500

## 2020-07-29 MED ORDER — PHENYLEPHRINE 40 MCG/ML (10ML) SYRINGE FOR IV PUSH (FOR BLOOD PRESSURE SUPPORT): 80.0000 ug | PREFILLED_SYRINGE | INTRAVENOUS | Status: DC | PRN

## 2020-07-29 NOTE — Progress Notes (Signed)
Labor Progress Note Victoria Mills is a 25 y.o. [id number] at [redacted]w[redacted]d presented for IOL-gHTN. S: Doing well without complaints.  O:  BP 130/70   Pulse 97   Temp 98.4 F (36.9 C) (Oral)   Resp 16   Ht 5\' 3"  (1.6 m)   Wt 82.1 kg   LMP 11/13/2019   BMI 32.06 kg/m  EFM: baseline 140bpm/mod variability/+ accels/ intermittent late decels with contractions Toco: q1-4 min  CVE: Dilation: 10 Dilation Complete Date: 07/29/20 Dilation Complete Time: 2316 Effacement (%): 100 Cervical Position: Posterior (deviated to mat RT) Station: Plus 1 Presentation: Vertex Exam by:: [email or web address], RN   A&P: 25 y.o. 30 [redacted]w[redacted]d presented for IOL-gHTN. #IOL: S/p FB, cyto x1. AROM 1920, clear fluid. Patient has progressed to complete, on my evaluation she is still at 0 station, will labor down to allow fetal descent given minimal movement with pushing. #Pain: epidural #FWB: cat 2, overall reassuring, expect delivery soon #GBS negative #gHTN: nml preE labs, asymptomatic, continue to monitor. #Anemia of pregnancy: admit hgb 9.5, PO vs IV iron postpartum.  [redacted]w[redacted]d, MD 11:27 PM

## 2020-07-29 NOTE — Anesthesia Preprocedure Evaluation (Signed)
Anesthesia Evaluation  Patient identified by MRN, date of birth, ID band Patient awake    Reviewed: Allergy & Precautions, Patient's Chart, lab work & pertinent test results  Airway Mallampati: II  TM Distance: >3 FB Neck ROM: Full    Dental no notable dental hx. (+) Teeth Intact   Pulmonary asthma ,    Pulmonary exam normal breath sounds clear to auscultation       Cardiovascular hypertension, Normal cardiovascular exam Rhythm:Regular Rate:Normal     Neuro/Psych negative neurological ROS  negative psych ROS   GI/Hepatic Neg liver ROS, GERD  ,  Endo/Other  Obesity  Renal/GU negative Renal ROS  negative genitourinary   Musculoskeletal negative musculoskeletal ROS (+)   Abdominal (+) + obese,   Peds  Hematology  (+) anemia ,   Anesthesia Other Findings   Reproductive/Obstetrics (+) Pregnancy Pre eclampsia 36 weeks                             Anesthesia Physical Anesthesia Plan  ASA: II  Anesthesia Plan: Epidural   Post-op Pain Management:    Induction:   PONV Risk Score and Plan:   Airway Management Planned: Natural Airway  Additional Equipment:   Intra-op Plan:   Post-operative Plan:   Informed Consent: I have reviewed the patients History and Physical, chart, labs and discussed the procedure including the risks, benefits and alternatives for the proposed anesthesia with the patient or authorized representative who has indicated his/her understanding and acceptance.       Plan Discussed with: Anesthesiologist  Anesthesia Plan Comments:         Anesthesia Quick Evaluation

## 2020-07-29 NOTE — Anesthesia Procedure Notes (Signed)
Epidural Patient location during procedure: OB Start time: 07/29/2020 4:50 PM End time: 07/29/2020 5:00 PM  Staffing Anesthesiologist: Mal Amabile, MD Performed: anesthesiologist   Preanesthetic Checklist Completed: patient identified, IV checked, site marked, risks and benefits discussed, surgical consent, monitors and equipment checked, pre-op evaluation and timeout performed  Epidural Patient position: sitting Prep: DuraPrep and site prepped and draped Patient monitoring: continuous pulse ox and blood pressure Approach: midline Location: L3-L4 Injection technique: LOR air  Needle:  Needle type: Tuohy  Needle gauge: 17 G Needle length: 9 cm and 9 Needle insertion depth: 5 cm cm Catheter type: closed end flexible Catheter size: 19 Gauge Catheter at skin depth: 10 cm Test dose: negative and Other  Assessment Events: blood not aspirated, injection not painful, no injection resistance, no paresthesia and negative IV test  Additional Notes Patient identified. Risks and benefits discussed including failed block, incomplete  Pain control, post dural puncture headache, nerve damage, paralysis, blood pressure Changes, nausea, vomiting, reactions to medications-both toxic and allergic and post Partum back pain. All questions were answered. Patient expressed understanding and wished to proceed. Sterile technique was used throughout procedure. Epidural site was Dressed with sterile barrier dressing. No paresthesias, signs of intravascular injection Or signs of intrathecal spread were encountered.  Patient was more comfortable after the epidural was dosed. Please see RN's note for documentation of vital signs and FHR which are stable. Reason for block:procedure for pain

## 2020-07-29 NOTE — H&P (Addendum)
OBSTETRIC ADMISSION HISTORY AND PHYSICAL  Victoria Mills is a 25 y.o. female [phone] with IUP at 34w0dby 7wk [place] I[place] for GHTN. She reports +FMs, No LOF, no VB, no blurry vision, headaches or peripheral edema, and RUQ pain.  She plans on breast feeding. She request Nexplanon for birth control. [facility] [place]--->  Estimated Date of Delivery: 08/19/20  Sono:    @[redacted]w[redacted]d , CWD, normal anatomy, Cephalic presentation, Anterior lie, 2802g, 56% EFW   Prenatal History/Complications: GHTN, Anemia, Hx of Pre-E in previous pregnancies  Past Medical History: Past Medical History:  Diagnosis Date  . Anemia   . Asthma    last inhaler use 07/23/2018  . Environmental allergies   . History of pre-eclampsia in prior pregnancy, currently pregnant   . Preeclampsia 2017    Past Surgical History: Past Surgical History:  Procedure Laterality Date  . HIP ARTHROSCOPY    . WISDOM TOOTH EXTRACTION      Obstetrical History: OB History    Gravida  4   Para  3   Term  2   Preterm  1   AB      Living  2     SAB      IAB      Ectopic      Multiple  0   Live Births  3        Obstetric Comments  2015- induced at 266 wksfor preeclampsia. Baby died at 121 monthsd/t hypoplastic lung        Social History Social History   Socioeconomic History  . Marital status: Single    Spouse name: Not on file  . Number of children: Not on file  . Years of education: Not on file  . Highest education level: Not on file  Occupational History  . Not on file  Tobacco Use  . Smoking status: Never Smoker  . Smokeless tobacco: Never Used  Vaping Use  . Vaping Use: Never used  Substance and Sexual Activity  . Alcohol use: Not Currently    Comment: last drink 12/11/19  . Drug use: No  . Sexual activity: Yes    Partners: Male    Birth control/protection: None  Other Topics Concern  . Not on file  Social History Narrative  . Not on  file   Social Determinants of Health   Financial Resource Strain: Not on file  Food Insecurity: Not on file  Transportation Needs: Not on file  Physical Activity: Not on file  Stress: Not on file  Social Connections: Not on file    Family History: Family History  Problem Relation Age of Onset  . Birth defects Son        hypoplastic lungs    Allergies: Allergies  Allergen Reactions  . Mushroom Extract Complex   . Pork-Derived Products     Medications Prior to Admission  Medication Sig Dispense Refill Last Dose  . albuterol (PROVENTIL) (2.5 MG/3ML) 0.083% nebulizer solution Inhale 3 mLs (2.5 mg total) into the lungs every 4 (four) hours as needed for wheezing or shortness of breath. 75 mL 12   . aspirin 81 MG chewable tablet Chew 1 tablet (81 mg total) by mouth daily. 30 tablet 8   . Blood Pressure Monitoring (BLOOD PRESSURE KIT) DEVI 1 kit by Does not apply route once a week. 1 each 0   . budesonide-formoterol (SYMBICORT) 160-4.5 MCG/ACT inhaler Inhale 2  puffs into the lungs 2 (two) times daily. 1 Inhaler 0   . cyclobenzaprine (FLEXERIL) 10 MG tablet Take 1 tablet (10 mg total) by mouth 3 (three) times daily as needed for muscle spasms. 30 tablet 0   . ferrous sulfate 325 (65 FE) MG tablet Take 1 tablet (325 mg total) by mouth every other day. 30 tablet 3   . Prenatal Vit-Fe Fumarate-FA (PRENATAL MULTIVITAMIN) TABS tablet Take 1 tablet by mouth daily at 12 noon.        Review of Systems   All systems reviewed and negative except as stated in HPI  Last menstrual period 11/13/2019, unknown if currently breastfeeding. General appearance: alert and cooperative Lungs: breathing comfortably Heart: regular rate and rhythm Abdomen: soft, non-tender; bowel sounds normal Pelvic: Feeling contractions Extremities: Homans sign is negative, no sign of DVT DTR's  Presentation: cephalic - by Leopold's will verify by U/S Fetal monitoringBaseline: 140 bpm, Variability: Good {> 6 bpm),  Accelerations: Reactive and Decelerations: Absent Uterine activityFrequency: Every 5 minutes     Prenatal labs: ABO, Rh: --/--/PENDING (04/22 0845) Antibody: PENDING (04/22 0845) Rubella: 2.42 (11/04 1044) RPR: Non Reactive (02/18 0943)  HBsAg: Negative (11/04 1044)  HIV: Non Reactive (02/18 0943)  GBS: Negative/-- (04/18 1131)  1 hr Glucola Negative Genetic screening Negative Anatomy US Normal  Prenatal Transfer Tool  Maternal Diabetes: No Genetic Screening: Normal Maternal Ultrasounds/Referrals: Normal Fetal Ultrasounds or other Referrals:  None Maternal Substance Abuse:  No Significant Maternal Medications:  None Significant Maternal Lab Results: Group B Strep negative  Results for orders placed or performed during the hospital encounter of 07/29/20 (from the past 24 hour(s))  Type and screen   Collection Time: 07/29/20  8:45 AM  Result Value Ref Range   ABO/RH(D) PENDING    Antibody Screen PENDING    Sample Expiration      08/01/2020,2359 Performed at [facility], [address], [place], [place] [address]     Patient Active Problem List   Diagnosis Date Noted  . Gestational hypertension w/o significant proteinuria in 3rd trimester 07/29/2020  . [redacted] weeks gestation of pregnancy 07/25/2020  . Gestational hypertension 07/19/2020  . Back pain affecting pregnancy in third trimester 07/12/2020  . Hx of preeclampsia, prior pregnancy, currently pregnant 02/11/2020  . Asthma complicating pregnancy, antepartum 02/11/2020  . Supervision of high-risk pregnancy 12/23/2019    Assessment/Plan:  [name] is a 25 y.o. [id number] at 25w0dhere for   #Labor:  Starting Cytotec and placing Foley bulb. #Pain: PRN, wants Epidural. #FWB: Category 1 #ID: GBS negative #MOF: Breast #MOC:Nexplanon #Circ: Yes #GHTN:  Intial BP Pre-E labs pending.   #Anemia: Hgb-9.5.  Plan for IV Iron PP. #Vaginal Discharge:  Significant cloudy vaginal discharge observed when bserved when   #Hx of asthma:  Avoid Hemabate  PDelora Fuel MD  07/29/2020, 9:11 AM  Informal Bedside Ultrasound Procedure Note Patient informed that the ultrasound is considered a limited OB ultrasound and is not intended to be a complete ultrasound exam.  Patient also informed that the ultrasound is not being completed with the intent of assessing for fetal or placental anomalies or any pelvic abnormalities.  Explained that the purpose of today's ultrasound is to assess for presentation.  Baby was found to be in a cephalic presentation. Patient acknowledges the purpose of the exam and the limitations of the study.  Attestation of Supervision of Student:  I confirm that I have verified the information documented in the resident's note and that  I have also personally reperformed the history, physical exam and all medical decision making activities.  I have verified that all services and findings are accurately documented in this student's note; and I agree with management and plan as outlined in the documentation. I have also made any necessary editorial changes.  See above BS U/S Procedure note.  Cervical Balloon placement: cervix easily visualized with speculum, Foley firmly grasped with Ring forceps, Foley placed through cervical os without difficulty, 60 mL instilled with RN assistance while Foley held in place with Ring forcep. Patient tolerated procedure well. RN to maintain constant tension on Foley and tape to inside of patient's upper thigh. Explained to patient that she can still ambulate, change positions, use birthing and peanut balls without any difficulty. Reviewed normal occurrences while having a Foley balloon in cervix: some bleeding, cramping or contractions and possible rupture of membranes. Explained that when balloon falls out of the cervix, her cervix should be dilated to 4-5 cm. The RN or OB provider will check her cervix after the balloon comes out. More than likely the next step would be to  proceed to Pitocin and consider AROM when in active labor pattern. Patient verbalized an understanding of the plan of care and agrees.    Laury Deep, [place] for [employer], [facility] 07/29/2020 12:04 PM

## 2020-07-29 NOTE — Progress Notes (Addendum)
Victoria Mills is a 25 y.o. [id number] at [redacted]w[redacted]d by LMP admitted for induction of labor due to Hypertension.  Subjective: Patient comfortable with epidural and desires to have AROM.  Objective: BP (!) 124/48   Pulse 78   Temp 98.1 F (36.7 C) (Oral)   Resp 16   Ht 5\' 3"  (1.6 m)   Wt 82.1 kg   LMP 11/13/2019   BMI 32.06 kg/m  I/O last 3 completed shifts: In: -  Out: 450 [Urine:450] No intake/output data recorded.  FHT:  FHR: 120 bpm, variability: moderate,  accelerations:  Present,  decelerations:  Absent UC:   regular, every 1-4 minutes SVE:   Dilation: 8 Effacement (%): 70 Station: -3 Exam by:: [email or web address], CNM  Labs: Lab Results  Component Value Date   WBC 11.5 (H) 07/29/2020   HGB 9.5 (L) 07/29/2020   HCT 30.1 (L) 07/29/2020   MCV 85.8 07/29/2020   PLT 237 07/29/2020    Assessment / Plan: Induction of labor due to gestational hypertension,  progressing well on pitocin  Labor: Progressing on Pitocin, will continue to increase Preeclampsia:  labs stable Fetal Wellbeing:  Category I Pain Control:  Epidural I/D:  n/a Anticipated MOD:  NSVD  07/31/2020, CNM 07/29/2020, 7:29 PM

## 2020-07-29 NOTE — Plan of Care (Signed)

## 2020-07-30 ENCOUNTER — Encounter (HOSPITAL_COMMUNITY): Payer: Self-pay | Admitting: Obstetrics and Gynecology

## 2020-07-30 DIAGNOSIS — O134 Gestational [pregnancy-induced] hypertension without significant proteinuria, complicating childbirth: Secondary | ICD-10-CM

## 2020-07-30 DIAGNOSIS — Z3A37 37 weeks gestation of pregnancy: Secondary | ICD-10-CM

## 2020-07-30 LAB — CBC
HCT: 31.4 % — ABNORMAL LOW (ref 36.0–46.0)
Hemoglobin: 9.9 g/dL — ABNORMAL LOW (ref 12.0–15.0)
MCH: 27.1 pg (ref 26.0–34.0)
MCHC: 31.5 g/dL (ref 30.0–36.0)
MCV: 86 fL (ref 80.0–100.0)
Platelets: 216 10*3/uL (ref 150–400)
RBC: 3.65 MIL/uL — ABNORMAL LOW (ref 3.87–5.11)
RDW: 12.5 % (ref 11.5–15.5)
WBC: 15.6 10*3/uL — ABNORMAL HIGH (ref 4.0–10.5)
nRBC: 0 % (ref 0.0–0.2)

## 2020-07-30 MED ORDER — IBUPROFEN 600 MG PO TABS: 600.0000 mg | ORAL_TABLET | Freq: Four times a day (QID) | ORAL | Status: DC

## 2020-07-30 MED ORDER — MEASLES, MUMPS & RUBELLA VAC IJ SOLR: 0.5000 mL | Freq: Once | INTRAMUSCULAR | Status: DC

## 2020-07-30 MED ORDER — ONDANSETRON HCL 4 MG/2ML IJ SOLN: 4.0000 mg | INTRAMUSCULAR | Status: DC | PRN

## 2020-07-30 MED ORDER — TERBUTALINE SULFATE 1 MG/ML IJ SOLN: 0.2500 mg | Freq: Once | INTRAMUSCULAR | Status: DC | PRN

## 2020-07-30 MED ORDER — SENNOSIDES-DOCUSATE SODIUM 8.6-50 MG PO TABS
2.0000 | ORAL_TABLET | ORAL | Status: DC
  Administered 2020-07-30 – 2020-07-31 (×2): 2 via ORAL
  Filled 2020-07-30 (×4): qty 2

## 2020-07-30 MED ORDER — COCONUT OIL OIL: 1.0000 "application " | TOPICAL_OIL | Status: DC | PRN

## 2020-07-30 MED ORDER — WITCH HAZEL-GLYCERIN EX PADS: 1.0000 "application " | MEDICATED_PAD | CUTANEOUS | Status: DC | PRN

## 2020-07-30 MED ORDER — ACETAMINOPHEN 325 MG PO TABS: 650.0000 mg | ORAL_TABLET | ORAL | Status: DC | PRN

## 2020-07-30 MED ORDER — PRENATAL MULTIVITAMIN CH
1.0000 | ORAL_TABLET | Freq: Every day | ORAL | Status: DC
  Filled 2020-07-30 (×2): qty 1

## 2020-07-30 MED ORDER — BENZOCAINE-MENTHOL 20-0.5 % EX AERO: 1.0000 "application " | INHALATION_SPRAY | CUTANEOUS | Status: DC | PRN

## 2020-07-30 MED ORDER — TETANUS-DIPHTH-ACELL PERTUSSIS 5-2.5-18.5 LF-MCG/0.5 IM SUSY: 0.5000 mL | PREFILLED_SYRINGE | Freq: Once | INTRAMUSCULAR | Status: DC

## 2020-07-30 MED ORDER — DIPHENHYDRAMINE HCL 25 MG PO CAPS: 25.0000 mg | ORAL_CAPSULE | Freq: Four times a day (QID) | ORAL | Status: DC | PRN

## 2020-07-30 MED ORDER — SIMETHICONE 80 MG PO CHEW: 80.0000 mg | CHEWABLE_TABLET | ORAL | Status: DC | PRN

## 2020-07-30 MED ORDER — IBUPROFEN 100 MG/5ML PO SUSP
600.0000 mg | Freq: Four times a day (QID) | ORAL | Status: DC
  Administered 2020-07-30 – 2020-07-31 (×6): 600 mg via ORAL
  Filled 2020-07-30 (×6): qty 30

## 2020-07-30 MED ORDER — FERROUS SULFATE 325 (65 FE) MG PO TABS
325.0000 mg | ORAL_TABLET | ORAL | Status: DC
Start: 2020-07-30 — End: 2020-07-31
  Administered 2020-07-30: 325 mg via ORAL
  Filled 2020-07-30: qty 1

## 2020-07-30 MED ORDER — DIBUCAINE (PERIANAL) 1 % EX OINT: 1.0000 "application " | TOPICAL_OINTMENT | CUTANEOUS | Status: DC | PRN

## 2020-07-30 MED ORDER — ONDANSETRON HCL 4 MG PO TABS: 4.0000 mg | ORAL_TABLET | ORAL | Status: DC | PRN

## 2020-07-30 MED ORDER — ASCORBIC ACID 500 MG PO TABS
250.0000 mg | ORAL_TABLET | ORAL | Status: DC
Start: 2020-07-30 — End: 2020-07-31
  Administered 2020-07-30: 250 mg via ORAL
  Filled 2020-07-30 (×2): qty 1

## 2020-07-30 MED ORDER — OXYTOCIN-SODIUM CHLORIDE 30-0.9 UT/500ML-% IV SOLN
1.0000 m[IU]/min | INTRAVENOUS | Status: DC
  Administered 2020-07-30: 2 m[IU]/min via INTRAVENOUS

## 2020-07-30 NOTE — Anesthesia Postprocedure Evaluation (Signed)
Anesthesia Post Note  Patient: Victoria Mills  Procedure(s) Performed: AN AD HOC LABOR EPIDURAL     Patient location during evaluation: Mother Baby Anesthesia Type: Epidural Level of consciousness: awake and alert Pain management: pain level controlled Vital Signs Assessment: post-procedure vital signs reviewed and stable Respiratory status: spontaneous breathing, nonlabored ventilation and respiratory function stable Cardiovascular status: stable Postop Assessment: no headache, no backache and epidural receding Anesthetic complications: no   No complications documented.  Last Vitals:  Vitals:   07/30/20 0425 07/30/20 0526  BP: (!) 142/69 135/74  Pulse: 73 71  Resp: 18 19  Temp: 37.2 C 36.8 C  SpO2: 99% 99%    Last Pain:  Vitals:   07/30/20 0526  TempSrc: Oral  PainSc:    Pain Goal:                   [facility]

## 2020-07-30 NOTE — Discharge Summary (Signed)
Postpartum Discharge Summary    Patient Name: Victoria Mills DOB: April 23, 1995 MRN: [id number]  Date of admission: 07/29/2020 Delivery date:07/30/2020  Delivering provider: Arrie Senate  Date of discharge: 07/31/2020  Admitting diagnosis: Gestational hypertension w/o significant proteinuria in 3rd trimester [O13.3] Intrauterine pregnancy: [redacted]w[redacted]d    Secondary diagnosis:  Active Problems:   Supervision of high-risk pregnancy   Hx of preeclampsia, prior pregnancy, currently pregnant   Asthma complicating pregnancy, antepartum   Gestational hypertension   Gestational hypertension w/o significant proteinuria in 3rd trimester   Vaginal delivery  Additional problems: none    Discharge diagnosis: Term Pregnancy Delivered, Gestational Hypertension and Anemia                                              Post partum procedures:n/a Augmentation: AROM, Pitocin, Cytotec and IP Foley Complications: None  Hospital course: Induction of Labor With Vaginal Delivery   25y.o. yo [phone] [id number] admitted to the hospital 07/29/2020 for induction of labor.  Indication for induction: Gestational hypertension.  Patient had an uncomplicated labor course as follows: Membrane Rupture Time/Date: 7:19 PM ,07/29/2020   Delivery Method:Vaginal, Spontaneous  Episiotomy: None  Lacerations:  None  Details of delivery can be found in separate delivery note.  Patient had a routine postpartum course. Patient is discharged home 07/31/20.  Newborn Data: Birth date:07/30/2020  Birth time:2:52 AM  Gender:Female  Living status:Living  Apgars:9 ,9  Weight:3206 g   Magnesium Sulfate received: No BMZ received: No Rhophylac:N/A MMR:N/A T-DaP:Given prenatally Flu: No Transfusion:No  Physical exam  Vitals:   07/30/20 1325 07/30/20 1717 07/30/20 2051 07/31/20 0548  BP: 128/79 132/80 139/88 115/66  Pulse: 67 66 62 67  Resp: 18 19 18 18   Temp: 97.8 F (36.6 C) 98 F (36.7 C) 98.1 F (36.7 C) 98.2 F (36.8  C)  TempSrc: Oral Oral Oral Oral  SpO2:  99%    Weight:      Height:       General: alert, cooperative and no distress Lochia: appropriate Uterine Fundus: firm Incision: N/A DVT Evaluation: No evidence of DVT seen on physical exam. Labs: Lab Results  Component Value Date   WBC 15.6 (H) 07/30/2020   HGB 9.9 (L) 07/30/2020   HCT 31.4 (L) 07/30/2020   MCV 86.0 07/30/2020   PLT 216 07/30/2020   CMP Latest Ref Rng & Units 07/29/2020  Glucose 70 - 99 mg/dL 114(H)  BUN 6 - 20 mg/dL 7  Creatinine 0.44 - 1.00 mg/dL 0.51  Sodium 135 - 145 mmol/L 135  Potassium 3.5 - 5.1 mmol/L 3.5  Chloride 98 - 111 mmol/L 106  CO2 22 - 32 mmol/L 22  Calcium 8.9 - 10.3 mg/dL 8.9  Total Protein 6.5 - 8.1 g/dL 5.9(L)  Total Bilirubin 0.3 - 1.2 mg/dL 0.4  Alkaline Phos 38 - 126 U/L 105  AST 15 - 41 U/L 21  ALT 0 - 44 U/L 12   Edinburgh Score: Edinburgh Postnatal Depression Scale Screening Tool 07/31/2020  I have been able to laugh and see the funny side of things. 0  I have looked forward with enjoyment to things. 0  I have blamed myself unnecessarily when things went wrong. 1  I have been anxious or worried for no good reason. 2  I have felt scared or panicky for no good reason. 1  Things have been getting on top of me. 1  I have been so unhappy that I have had difficulty sleeping. 1  I have felt sad or miserable. 2  I have been so unhappy that I have been crying. 1  The thought of harming myself has occurred to me. 0  Edinburgh Postnatal Depression Scale Total 9     After visit meds:  Allergies as of 07/31/2020      Reactions   Mushroom Extract Complex    Pork-derived Products       Medication List    TAKE these medications   albuterol (2.5 MG/3ML) 0.083% nebulizer solution Commonly known as: PROVENTIL Inhale 3 mLs (2.5 mg total) into the lungs every 4 (four) hours as needed for wheezing or shortness of breath.   aspirin 81 MG chewable tablet Chew 1 tablet (81 mg total) by mouth  daily.   Blood Pressure Kit Devi 1 kit by Does not apply route once a week.   budesonide-formoterol 160-4.5 MCG/ACT inhaler Commonly known as: Symbicort Inhale 2 puffs into the lungs 2 (two) times daily.   cyclobenzaprine 10 MG tablet Commonly known as: FLEXERIL Take 1 tablet (10 mg total) by mouth 3 (three) times daily as needed for muscle spasms.   ferrous sulfate 325 (65 FE) MG tablet Take 1 tablet (325 mg total) by mouth every other day.   ibuprofen 800 MG tablet Commonly known as: ADVIL Take 1 tablet (800 mg total) by mouth 3 (three) times daily.   NIFEdipine 30 MG 24 hr tablet Commonly known as: ADALAT CC Take 1 tablet (30 mg total) by mouth daily.   prenatal multivitamin Tabs tablet Take 1 tablet by mouth daily at 12 noon.        Discharge home in stable condition Infant Feeding: Breast Infant Disposition:home with mother Discharge instruction: per After Visit Summary and Postpartum booklet. Activity: Advance as tolerated. Pelvic rest for 6 weeks.  Diet: routine diet Future Appointments:No future appointments. Follow up Visit:  [facility].   Why: This week for a BP check Contact information: [address] [place] [address] [phone]             Message sent to [facility] 07/30/20 by Sylvester Harder.   Please schedule this patient for a In person postpartum visit in 6 weeks with the following provider: Any provider. Additional Postpartum F/U:BP check 1 week  High risk pregnancy complicated by: HTN Delivery mode:  Vaginal, Spontaneous  Anticipated Birth Control:  Nexplanon   07/31/2020 Wende Mott, CNM

## 2020-07-30 NOTE — Lactation Note (Signed)
This note was copied from a baby's chart. Lactation Consultation Note  Patient Name: Victoria Mills [id number] Date: 07/30/2020 Reason for consult: Initial assessment;Early term 37-38.6wks Age:26 hours  Visited with mom of 14 hours old ETI female, she's a P3 and experienced BF. Mom said baby has been having difficulty latching on, he keeps sucking on his tongue and won't open his mouth wide enough. LC assisted with latch, took baby STS to mother's right breast in football position and attempted to latch but he was unable to.  Baby would do a few sucks and then stop, he kept sucking on his fists and fingers. Assisted mom with hand expression and spoon fed baby about 1 ml of EBM. LC also offered to set up a pump, mom said she'll think about it and let us know. Offered donor milk as well, mom aware she needs to start pumping if baby continues having difficulty hatching.  Reviewed normal newborn behavior, cluster feeding, feeding cues, size of baby's stomach and lactogenesis II. Baby doing STS with mom when exiting the room.  Feeding plan:  1. Encouraged mom to feed baby STS 8-12 times/24 hours or sooner if feedings  2. Hand expression and spoon feeding were also strongly encouraged  BF brochure, BF resources and feeding diary were reviewed. No support person in mom's room at the time of Lactation consultation. Mom reported all questions and concerns were answered, she's aware of LC OP services and will call PRN.  Maternal Data Has patient been taught Hand Expression?: Yes Does the patient have breastfeeding experience prior to this delivery?: Yes How long did the patient breastfeed?: 1st one for 11 months and the 2nd 9 months  Feeding Mother's Current Feeding Choice: Breast Milk  LATCH Score Latch: Repeated attempts needed to sustain latch, nipple held in mouth throughout feeding, stimulation needed to elicit sucking reflex.  Audible Swallowing: None  Type of Nipple: Everted at rest and  after stimulation  Comfort (Breast/Nipple): Soft / non-tender  Hold (Positioning): Assistance needed to correctly position infant at breast and maintain latch.  LATCH Score: 6   Lactation Tools Discussed/Used    Interventions Interventions: Breast feeding basics reviewed  Discharge Pump: Personal (DEBP at home) [facility] Program: No  Consult Status Consult Status: Follow-up Date: 07/31/20 Follow-up type: In-patient    Sakara Lehtinen Venetia Constable 07/30/2020, 5:22 PM

## 2020-07-30 NOTE — Discharge Instructions (Signed)
-take tylenol 1000 mg every 6 hours as needed for pain, alternate with ibuprofen 600 mg every 6 hours -drink plenty of water to help with breastfeeding -continue prenatal vitamins while you are breastfeeding -take iron pills every other day with vitamin c, this will help healing as well as breast feeding -think about birth control options-->bedisider.org is a great website! You can get any form of birth control from the health department for free   Postpartum Care After Vaginal Delivery The following information offers guidance about how to care for yourself from the time you deliver your baby to 6-12 weeks after delivery (postpartum period). If you have problems or questions, contact your health care provider for more specific instructions. Follow these instructions at home: Vaginal bleeding  It is normal to have vaginal bleeding (lochia) after delivery. Wear a sanitary pad for bleeding and discharge. ? During the first week after delivery, the amount and appearance of lochia is often similar to a menstrual period. ? Over the next few weeks, it will gradually decrease to a dry, yellow-brown discharge. ? For most women, lochia stops completely by 4-6 weeks after delivery, but can vary.  Change your sanitary pads frequently. Watch for any changes in your flow, such as: ? A sudden increase in volume. ? A change in color. ? Large blood clots.  If you pass a blood clot from your vagina, save it and call your health care provider. Do not flush blood clots down the toilet before talking with your health care provider.  Do not use tampons or douches until your health care provider approves.  If you are not breastfeeding, your period should return 6-8 weeks after delivery. If you are feeding your baby breast milk only, your period may not return until you stop breastfeeding. Perineal care  Keep the area between the vagina and the anus (perineum) clean and dry. Use medicated pads and  pain-relieving sprays and creams as directed.  If you had a surgical cut in the perineum (episiotomy) or a tear, check the area for signs of infection until you are healed. Check for: ? More redness, swelling, or pain. ? Fluid or blood coming from the cut or tear. ? Warmth. ? Pus or a bad smell.  You may be given a squirt bottle to use instead of wiping to clean the perineum area after you use the bathroom. Pat the area gently to dry it.  To relieve pain caused by an episiotomy, a tear, or swollen veins in the anus (hemorrhoids), take a warm sitz bath 2-3 times a day. In a sitz bath, the warm water should only come up to your hips and cover your buttocks.   Breast care  In the first few days after delivery, your breasts may feel heavy, full, and uncomfortable (breast engorgement). Milk may also leak from your breasts. Ask your health care provider about ways to help relieve the discomfort.  If you are breastfeeding: ? Wear a bra that supports your breasts and fits well. Use breast pads to absorb milk that leaks. ? Keep your nipples clean and dry. Apply creams and ointments as told. ? You may have uterine contractions every time you breastfeed for up to several weeks after delivery. This helps your uterus return to its normal size. ? If you have any problems with breastfeeding, notify your health care provider or lactation consultant.  If you are not breastfeeding: ? Avoid touching your breasts. Do not squeeze out (express) milk. Doing this can make your   breasts produce more milk. ? Wear a good-fitting bra and use cold packs to help with swelling. Intimacy and sexuality  Ask your health care provider when you can engage in sexual activity. This may depend upon: ? Your risk of infection. ? How fast you are healing. ? Your comfort and desire to engage in sexual activity.  You are able to get pregnant after delivery, even if you have not had your period. Talk with your health care provider  about methods of birth control (contraception) or family planning if you desire future pregnancies. Medicines  Take over-the-counter and prescription medicines only as told by your health care provider.  Take an over-the-counter stool softener to help ease bowel movements as told by your health care provider.  If you were prescribed an antibiotic medicine, take it as told by your health care provider. Do not stop taking the antibiotic even if you start to feel better.  Review all previous and current prescriptions to check for possible transfer into breast milk. Activity  Gradually return to your normal activities as told by your health care provider.  Rest as much as possible. Nap while your baby is sleeping. Eating and drinking  Drink enough fluid to keep your urine pale yellow.  To help prevent or relieve constipation, eat high-fiber foods every day.  Choose healthy eating to support breastfeeding or weight loss goals.  Take your prenatal vitamins until your health care provider tells you to stop.   General tips/recommendations  Do not use any products that contain nicotine or tobacco. These products include cigarettes, chewing tobacco, and vaping devices, such as e-cigarettes. If you need help quitting, ask your health care provider.  Do not drink alcohol, especially if you are breastfeeding.  Do not take medications or drugs that are not prescribed to you, especially if you are breastfeeding.  Visit your health care provider for a postpartum checkup within the first 3-6 weeks after delivery.  Complete a comprehensive postpartum visit no later than 12 weeks after delivery.  Keep all follow-up visits for you and your baby. Contact a health care provider if:  You feel unusually sad or worried.  Your breasts become red, painful, or hard.  You have a fever or other signs of an infection.  You have bleeding that is soaking through one pad an hour or you have blood  clots.  You have a severe headache that doesn't go away or you have vision changes.  You have nausea and vomiting and are unable to eat or drink anything for 24 hours. Get help right away if:  You have chest pain or difficulty breathing.  You have sudden, severe leg pain.  You faint or have a seizure.  You have thoughts about hurting yourself or your baby. If you ever feel like you may hurt yourself or others, or have thoughts about taking your own life, get help right away. Go to your nearest emergency department or:  Call your local emergency services (911 in the [place]  The National Suicide Prevention Lifeline at 1-800-273-8255. This suicide crisis helpline is open 24 hours a day.  Text the Crisis Text Line at 741741 (in the [place] Summary  The period of time after you deliver your newborn up to 6-12 weeks after delivery is called the postpartum period.  Keep all follow-up visits for you and your baby.  Review all previous and current prescriptions to check for possible transfer into breast milk.  Contact a health care provider if you feel   unusually sad or worried during the postpartum period. This information is not intended to replace advice given to you by your health care provider. Make sure you discuss any questions you have with your health care provider. Document Revised: 12/10/2019 Document Reviewed: 12/10/2019 Elsevier Patient Education  2021 Elsevier Inc.  

## 2020-07-31 MED ORDER — IBUPROFEN 800 MG PO TABS: 800.0000 mg | ORAL_TABLET | Freq: Three times a day (TID) | ORAL | 0 refills | Status: AC

## 2020-07-31 MED ORDER — NIFEDIPINE ER 30 MG PO TB24: 30.0000 mg | ORAL_TABLET | Freq: Every day | ORAL | 0 refills | Status: DC

## 2020-07-31 MED ORDER — ETONOGESTREL 68 MG ~~LOC~~ IMPL
68.0000 mg | DRUG_IMPLANT | Freq: Once | SUBCUTANEOUS | Status: AC
  Administered 2020-07-31: 68 mg via SUBCUTANEOUS
  Filled 2020-07-31: qty 1

## 2020-07-31 MED ORDER — LIDOCAINE HCL 1 % IJ SOLN
0.0000 mL | Freq: Once | INTRAMUSCULAR | Status: AC | PRN
Start: 2020-07-31 — End: 2020-07-31
  Administered 2020-07-31: 3 mL via INTRADERMAL
  Filled 2020-07-31: qty 20

## 2020-07-31 MED ORDER — AMLODIPINE BESYLATE 5 MG PO TABS: 5.0000 mg | ORAL_TABLET | Freq: Every day | ORAL | Status: DC

## 2020-07-31 MED ORDER — NIFEDIPINE ER OSMOTIC RELEASE 30 MG PO TB24
30.0000 mg | ORAL_TABLET | Freq: Every day | ORAL | Status: DC
  Administered 2020-07-31: 30 mg via ORAL
  Filled 2020-07-31: qty 1

## 2020-07-31 NOTE — Progress Notes (Addendum)
POSTPARTUM PROGRESS NOTE  Post Partum Day 1  Subjective:  Victoria Mills is a 25 y.o. [id number] s/p VD at [redacted]w[redacted]d.  She reports she is doing well. No acute events overnight. She denies any problems with ambulating, voiding or po intake. Denies nausea or vomiting.  Pain is well controlled.  Lochia is mild.  Objective: Blood pressure 115/66, pulse 67, temperature 98.2 F (36.8 C), temperature source Oral, resp. rate 18, height 5\' 3"  (1.6 m), weight 82.1 kg, last menstrual period 11/13/2019, SpO2 99 %, unknown if currently breastfeeding.  Physical Exam:  General: alert, cooperative and no distress Chest: no respiratory distress Heart:regular rate Abdomen: soft  Uterine Fundus: firm, appropriately tender DVT Evaluation: No calf swelling or tenderness Extremities: no edema Skin: warm, dry  Recent Labs    07/29/20 0851 07/30/20 0323  HGB 9.5* 9.9*  HCT 30.1* 31.4*    Assessment/Plan: Victoria Mills is a 25 y.o. 30 s/p VD/IOL for gHTN at [redacted]w[redacted]d   PPD#1 - Doing well  Routine postpartum care  Iron deficiency anemia:  Hgb 9.9. Asymptomatic. Cont ferrous sulfate daily.   Elevated BP, with history of gestational hypertension:  Several BP >130 in the past 24 hours. Asymptomatic. Start Procardia XL 30mg .   Contraception: Nexplanon (inpatient, will need consent/procedure today)  Feeding: breastfeeding  Dispo: Plan for discharge tomorrow.   LOS: 2 days   [redacted]w[redacted]d, DO  Family Medicine PGY-3  07/31/2020, 7:15 AM

## 2020-07-31 NOTE — Progress Notes (Signed)
Victoria Mills  07/31/2020  Keilana Morlock February 01, 1996 [id number]           CSW received consult due to score of 9 on Edinburgh Postnatal Depression Screen.    CSW met with MOB at bedside.  MOB denied currently experiencing any symptoms of Depression, Anxiety, Grief, Sadness, Loneliness, etc.  MOB also denied feelings of homicidal or suicidal ideations.  MOB denied being a victim of domestic violence, nor is she fearful for her life, or that of her newborn.  MOB admitted to experiencing a great deal of anxiety prior to babies birth because, "they was just a lot going on in my life at the time, but it's all been resolved now".  MOB reports no concerns at present , and denies the need for counseling, supportive services and/or psychotropic medications.  CSW provided education regarding Baby Blues versus PMADs and provided MOB with resources for mental health follow-up.  CSW encouraged MOB to evaluate her mental health throughout the postpartum period with the use of the New Mom Checklist developed by [employer]partum [employer] as well as the [place] Postnatal Depression Scale and notify a medical professional if symptoms arise.    Please contact the Clinical Social Worker if needs arise, by [facility] request, or if MOB scores greater than 9/yes to question 10 on Edinburgh Postpartum Depression Screen.  MOB is scheduled for discharge home with baby today.    Nat Christen, BSW, MSW, [employer]  Licensed Holiday representative  [employer]  Mailing Address-[address], [place], [place] [address] Physical Address-[address] [place],  [address] Toll Free Main # [phone] Fax # [phone] Cell # [phone]  [email or web address]

## 2020-07-31 NOTE — Progress Notes (Signed)
Post-Placental Nexplanon Insertion Procedure Note  Patient was identified. Informed consent was signed, signed copy in chart. A time-out was performed.    The insertion site was identified 8-10 cm (3-4 inches) from the medial epicondyle of the humerus and 3-5 cm (1.25-2 inches) posterior to (below) the sulcus (groove) between the biceps and triceps muscles of the patient's left arm and marked. The site was prepped and draped in the usual sterile fashion. Pt was prepped with alcohol swab and then injected with 3 cc of 1% lidocaine. The site was prepped with betadine. Nexplanon removed form packaging, device confirmed in needle, then inserted full length of needle and withdrawn per handbook instructions. Provider and patient verified presence of the implant in the woman's arm by palpation. Pt insertion site was covered with steristrips/adhesive bandage and pressure bandage. There was minimal blood loss. Patient tolerated procedure well.  Patient was given post procedure instructions and Nexplanon user card with expiration date. Condoms were recommended for STI prevention. Patient was asked to keep the pressure dressing on for 24 hours to minimize bruising and keep the adhesive bandage on for 3-5 days. The patient verbalized understanding of the plan of care and agrees.   Lot # [id number] Expiration Date 10/05/2022    Brand Males, CNM 07/31/20 3:13 PM

## 2020-07-31 NOTE — Lactation Note (Addendum)
This note was copied from a baby's chart. Lactation Consultation Note  Patient Name: Victoria Mills [id number] Date: 07/31/2020 Reason for consult: Follow-up assessment;Early term 65-38.6wks Age:26 hours  Follow up with 37 hours old infant with 4.24% weight loss at the time of this visit, per provider's request.  Mother reports difficulty keeping infant latched. Infant seems to be popping on and off nipple.  Infant is latched in cradle position to left breast upon arrival. Observed suboptimal position, lack of neck/back support and shallow latch. LC offered assistance and transitioned mother to a modified cradle. Used pillows for support and demonstrated chin tug for a deeper latch as needed.  Infant is suckling with rhythm, swallowing, and moving breast tissue. Mother confirms better and more comfortable position/latch. Infant has had good output.   Discharge Plan: 1-Skin to skin 2-Aim for a deep, comfortable latch 3-Breastfeeding on demand or 8-12 times in 24h period. 4-Keep infant awake during breastfeeding session: massaging breast, infant's hand/shoulder/feet 5-Monitor voids and stools as signs good intake.  6-Encouraged maternal rest, hydration and food intake.  7-Contact LC as needed for feeds/support/concerns/questions  All questions answered. Reviewed Lactation Services brochure and promoted [employer] booklet. Encouraged to call for support.  Feeding Mother's Current Feeding Choice: Breast Milk  LATCH Score Latch: Grasps breast easily, tongue down, lips flanged, rhythmical sucking.  Audible Swallowing: Spontaneous and intermittent  Type of Nipple: Everted at rest and after stimulation  Comfort (Breast/Nipple): Soft / non-tender  Hold (Positioning): Assistance needed to correctly position infant at breast and maintain latch.  LATCH Score: 9  Interventions Interventions: Breast feeding basics reviewed;Assisted with latch;Skin to skin;Breast massage;Hand express;Breast  compression;Adjust position;Support pillows;Position options;Expressed milk;Education  Discharge Discharge Education: Engorgement and breast care;Warning signs for feeding baby  Consult Status Consult Status: Complete Date: 07/31/20 Follow-up type: Call as needed    Tashiba Timoney A Higuera Ancidey 07/31/2020, 4:04 PM

## 2020-08-02 LAB — SURGICAL PATHOLOGY

## 2020-08-03 ENCOUNTER — Ambulatory Visit: Payer: Medicaid Other

## 2020-08-04 ENCOUNTER — Encounter (HOSPITAL_COMMUNITY): Payer: Self-pay | Admitting: Obstetrics and Gynecology

## 2020-08-04 ENCOUNTER — Ambulatory Visit (INDEPENDENT_AMBULATORY_CARE_PROVIDER_SITE_OTHER): Payer: Medicaid Other

## 2020-08-04 ENCOUNTER — Other Ambulatory Visit: Payer: Self-pay

## 2020-08-04 ENCOUNTER — Inpatient Hospital Stay (HOSPITAL_COMMUNITY)
Admission: AD | Admit: 2020-08-04 | Discharge: 2020-08-04 | Disposition: A | Discharge status: Home / Self Care | Payer: Medicaid Other | Attending: Obstetrics and Gynecology | Admitting: Obstetrics and Gynecology

## 2020-08-04 VITALS — BP 158/89 | HR 58

## 2020-08-04 DIAGNOSIS — O169 Unspecified maternal hypertension, unspecified trimester: Secondary | ICD-10-CM | POA: Diagnosis present

## 2020-08-04 DIAGNOSIS — O9089 Other complications of the puerperium, not elsewhere classified: Secondary | ICD-10-CM | POA: Diagnosis not present

## 2020-08-04 DIAGNOSIS — G44209 Tension-type headache, unspecified, not intractable: Secondary | ICD-10-CM

## 2020-08-04 DIAGNOSIS — Z0130 Encounter for examination of blood pressure without abnormal findings: Secondary | ICD-10-CM

## 2020-08-04 DIAGNOSIS — O135 Gestational [pregnancy-induced] hypertension without significant proteinuria, complicating the puerperium: Secondary | ICD-10-CM | POA: Diagnosis not present

## 2020-08-04 LAB — CBC
HCT: 34.8 % — ABNORMAL LOW (ref 36.0–46.0)
Hemoglobin: 10.7 g/dL — ABNORMAL LOW (ref 12.0–15.0)
MCH: 26.8 pg (ref 26.0–34.0)
MCHC: 30.7 g/dL (ref 30.0–36.0)
MCV: 87 fL (ref 80.0–100.0)
Platelets: 282 10*3/uL (ref 150–400)
RBC: 4 MIL/uL (ref 3.87–5.11)
RDW: 12.9 % (ref 11.5–15.5)
WBC: 10.5 10*3/uL (ref 4.0–10.5)
nRBC: 0 % (ref 0.0–0.2)

## 2020-08-04 LAB — COMPREHENSIVE METABOLIC PANEL
ALT: 26 U/L (ref 0–44)
AST: 22 U/L (ref 15–41)
Albumin: 2.9 g/dL — ABNORMAL LOW (ref 3.5–5.0)
Alkaline Phosphatase: 89 U/L (ref 38–126)
Anion gap: 7 (ref 5–15)
BUN: 5 mg/dL — ABNORMAL LOW (ref 6–20)
CO2: 26 mmol/L (ref 22–32)
Calcium: 8.9 mg/dL (ref 8.9–10.3)
Chloride: 106 mmol/L (ref 98–111)
Creatinine, Ser: 0.62 mg/dL (ref 0.44–1.00)
GFR, Estimated: >60 mL/min (ref >60)
Glucose, Bld: 83 mg/dL (ref 70–99)
Potassium: 3.9 mmol/L (ref 3.5–5.1)
Sodium: 139 mmol/L (ref 135–145)
Total Bilirubin: 0.4 mg/dL (ref 0.3–1.2)
Total Protein: 6.3 g/dL — ABNORMAL LOW (ref 6.5–8.1)

## 2020-08-04 LAB — URINALYSIS, ROUTINE W REFLEX MICROSCOPIC
Bacteria, UA: NONE SEEN
Bilirubin Urine: NEGATIVE
Glucose, UA: NEGATIVE mg/dL
Ketones, ur: NEGATIVE mg/dL
Leukocytes,Ua: NEGATIVE
Nitrite: NEGATIVE
Protein, ur: NEGATIVE mg/dL
Specific Gravity, Urine: 1.02 (ref 1.005–1.030)
pH: 6 (ref 5.0–8.0)

## 2020-08-04 MED ORDER — NIFEDIPINE ER 60 MG PO TB24: 60.0000 mg | ORAL_TABLET | Freq: Every day | ORAL | 1 refills | Status: AC

## 2020-08-04 MED ORDER — OXYCODONE HCL 5 MG PO TABS
5.0000 mg | ORAL_TABLET | Freq: Once | ORAL | Status: AC
  Administered 2020-08-04: 5 mg via ORAL
  Filled 2020-08-04: qty 1

## 2020-08-04 NOTE — MAU Provider Note (Signed)
History     CSN: [id number]  Arrival date and time: 08/04/20 1115   Event Date/Time   First Provider Initiated Contact with Patient 08/04/20 1349      Chief Complaint  Patient presents with  . Headache  . Hypertension   25 y.o. G4P4 s/p SVD 5 days ago sent from office for elevated BP. Her pregnancy was complicated by gHTN and she was started on Procardia at discharge. Reports intermittent HA, lasting for a few minutes in the right frontal region. Denies visual disturbances, RUQ pain, CP, SOB. She took her Procardia around 6am today.   OB History    Gravida  4   Para  4   Term  3   Preterm  1   AB      Living  3     SAB      IAB      Ectopic      Multiple  0   Live Births  4        Obstetric Comments  2015- induced at 29 wks for preeclampsia. Baby died at 66 months d/t hypoplastic lung        Past Medical History:  Diagnosis Date  . Anemia   . Asthma    last inhaler use 07/23/2018  . Environmental allergies   . History of pre-eclampsia in prior pregnancy, currently pregnant   . Preeclampsia 2017    Past Surgical History:  Procedure Laterality Date  . HIP ARTHROSCOPY    . WISDOM TOOTH EXTRACTION      Family History  Problem Relation Age of Onset  . Birth defects Son        hypoplastic lungs    Social History   Tobacco Use  . Smoking status: Never Smoker  . Smokeless tobacco: Never Used  Vaping Use  . Vaping Use: Never used  Substance Use Topics  . Alcohol use: Not Currently    Comment: last drink 12/11/19  . Drug use: No    Allergies:  Allergies  Allergen Reactions  . Mushroom Extract Complex   . Pork-Derived Products     No medications prior to admission.    Review of Systems  Eyes: Negative for visual disturbance.  Respiratory: Negative for shortness of breath.   Cardiovascular: Negative for chest pain and leg swelling.  Gastrointestinal: Negative for abdominal pain.  Neurological: Positive for headaches.   Physical  Exam   Blood pressure 140/77, pulse (!) 55, temperature 98 F (36.7 C), temperature source Oral, resp. rate 16, height 5' 3"  (1.6 m), weight 78.9 kg, SpO2 100 %, unknown if currently breastfeeding. Patient Vitals for the past 24 hrs:  BP Temp Temp src Pulse Resp SpO2 Height Weight  08/04/20 1515 140/77 -- -- (!) 55 -- -- -- --  08/04/20 1500 138/81 -- -- 61 -- -- -- --  08/04/20 1445 135/72 -- -- (!) 57 -- 100 % -- --  08/04/20 1430 133/71 -- -- 68 -- 100 % -- --  08/04/20 1416 (!) 142/81 -- -- 67 -- -- -- --  08/04/20 1401 125/75 -- -- (!) 58 -- -- -- --  08/04/20 1346 126/69 -- -- 63 -- -- -- --  08/04/20 1333 132/74 -- -- 62 -- -- -- --  08/04/20 1234 (!) 149/82 98 F (36.7 C) Oral 61 16 100 % -- --  08/04/20 1230 -- -- -- -- -- -- 5' 3"  (1.6 m) 78.9 kg    Physical Exam Vitals and nursing note reviewed.  Constitutional:      General: She is not in acute distress.    Appearance: Normal appearance.  HENT:     Head: Normocephalic and atraumatic.  Cardiovascular:     Rate and Rhythm: Normal rate.  Pulmonary:     Effort: Pulmonary effort is normal. No respiratory distress.  Musculoskeletal:        General: No swelling.     Cervical back: Normal range of motion.  Skin:    General: Skin is warm and dry.  Neurological:     General: No focal deficit present.     Mental Status: She is alert and oriented to person, place, and time.  Psychiatric:        Mood and Affect: Mood normal.        Behavior: Behavior normal.    Results for orders placed or performed during the hospital encounter of 08/04/20 (from the past 24 hour(s))  Comprehensive metabolic panel     Status: Abnormal   Collection Time: 08/04/20 11:28 AM  Result Value Ref Range   Sodium 139 135 - 145 mmol/L   Potassium 3.9 3.5 - 5.1 mmol/L   Chloride 106 98 - 111 mmol/L   CO2 26 22 - 32 mmol/L   Glucose, Bld 83 70 - 99 mg/dL   BUN 5 (L) 6 - 20 mg/dL   Creatinine, Ser 0.62 0.44 - 1.00 mg/dL   Calcium 8.9 8.9 - 10.3  mg/dL   Total Protein 6.3 (L) 6.5 - 8.1 g/dL   Albumin 2.9 (L) 3.5 - 5.0 g/dL   AST 22 15 - 41 U/L   ALT 26 0 - 44 U/L   Alkaline Phosphatase 89 38 - 126 U/L   Total Bilirubin 0.4 0.3 - 1.2 mg/dL   GFR, Estimated >60 >60 mL/min   Anion gap 7 5 - 15  CBC     Status: Abnormal   Collection Time: 08/04/20 11:28 AM  Result Value Ref Range   WBC 10.5 4.0 - 10.5 K/uL   RBC 4.00 3.87 - 5.11 MIL/uL   Hemoglobin 10.7 (L) 12.0 - 15.0 g/dL   HCT 34.8 (L) 36.0 - 46.0 %   MCV 87.0 80.0 - 100.0 fL   MCH 26.8 26.0 - 34.0 pg   MCHC 30.7 30.0 - 36.0 g/dL   RDW 12.9 11.5 - 15.5 %   Platelets 282 150 - 400 K/uL   nRBC 0.0 0.0 - 0.2 %  Urinalysis, Routine w reflex microscopic Urine, Clean Catch     Status: Abnormal   Collection Time: 08/04/20  1:05 PM  Result Value Ref Range   Color, Urine YELLOW YELLOW   APPearance CLEAR CLEAR   Specific Gravity, Urine 1.020 1.005 - 1.030   pH 6.0 5.0 - 8.0   Glucose, UA NEGATIVE NEGATIVE mg/dL   Hgb urine dipstick LARGE (A) NEGATIVE   Bilirubin Urine NEGATIVE NEGATIVE   Ketones, ur NEGATIVE NEGATIVE mg/dL   Protein, ur NEGATIVE NEGATIVE mg/dL   Nitrite NEGATIVE NEGATIVE   Leukocytes,Ua NEGATIVE NEGATIVE   WBC, UA 0-5 0 - 5 WBC/hpf   Bacteria, UA NONE SEEN NONE SEEN   Squamous Epithelial / LPF 0-5 0 - 5   Mucus PRESENT    MAU Course  Procedures Oxycodone  MDM Labs ordered and reviewed. HA resolved with meds. No severe range BPs. Consult with Dr. Dione Plover, plan to increase Procardia to 60 XL daily. Stable for discharge home.   Assessment and Plan   1. Hypertension in pregnancy, delivered  2. Acute non intractable tension-type headache    Discharge home Follow up at [facility] next week for BP check- message sent PEC precautions Rx Procardia 60 mg XL daily  Allergies as of 08/04/2020      Reactions   Mushroom Extract Complex    Pork-derived Products       Medication List    STOP taking these medications   aspirin 81 MG chewable tablet      TAKE these medications   albuterol (2.5 MG/3ML) 0.083% nebulizer solution Commonly known as: PROVENTIL Inhale 3 mLs (2.5 mg total) into the lungs every 4 (four) hours as needed for wheezing or shortness of breath.   Blood Pressure Kit Devi 1 kit by Does not apply route once a week.   budesonide-formoterol 160-4.5 MCG/ACT inhaler Commonly known as: Symbicort Inhale 2 puffs into the lungs 2 (two) times daily.   cyclobenzaprine 10 MG tablet Commonly known as: FLEXERIL Take 1 tablet (10 mg total) by mouth 3 (three) times daily as needed for muscle spasms.   ferrous sulfate 325 (65 FE) MG tablet Take 1 tablet (325 mg total) by mouth every other day.   ibuprofen 800 MG tablet Commonly known as: ADVIL Take 1 tablet (800 mg total) by mouth 3 (three) times daily.   NIFEdipine 60 MG 24 hr tablet Commonly known as: ADALAT CC Take 1 tablet (60 mg total) by mouth daily. What changed:   medication strength  how much to take   prenatal multivitamin Tabs tablet Take 1 tablet by mouth daily at 12 noon.      Julianne Handler, CNM 08/04/2020, 4:56 PM

## 2020-08-04 NOTE — Progress Notes (Signed)
Subjective:  Victoria Mills is a 25 y.o. female here for BP check s/p NSVD on 07/30/20.  Reports L arm pain s/p Nexplanon insertion on 07/31/20. She took Nifedipine 24 hr 30 mg at 6 am today. She is complaining of HA's.   Hypertension ROS: Reports headaches, taking medications as instructed, no medication side effects noted, no TIA's, no chest pain on exertion, no dyspnea on exertion and no swelling of ankles.    Objective:  BP (!) 158/89   Pulse (!) 58   LMP 11/13/2019   BP 163/90 Pulse (!) 50 Appearance alert, well appearing, and in no distress. General exam BP noted to need further observation.  Assessment:   Blood Pressure needs further observation.   Plan:  Consulted with MD, pt to report to MAU for an evaluation.

## 2020-08-04 NOTE — MAU Note (Signed)
Victoria Mills is a 25 y.o. here in MAU reporting: PP post SVD on 07/30/20. Was sent over from the office for HTN. Denies visual changes. Does report an intermittent headache.  Onset of complaint: today  Pain score: 0/10  Vitals:   08/04/20 1234  BP: (!) 149/82  Pulse: 61  Resp: 16  Temp: 98 F (36.7 C)  SpO2: 100%     Lab orders placed from triage: entered by provider

## 2020-08-04 NOTE — Discharge Instructions (Signed)
Hypertension During Pregnancy Hypertension is also called high blood pressure. High blood pressure means that the force of the blood moving in your body is high enough to cause problems for you and your baby. Different types of high blood pressure can happen during pregnancy. The types are:  High blood pressure before you got pregnant. This is called chronic hypertension.  This can continue during your pregnancy. Your doctor will want to keep checking your blood pressure. You may need medicine to control your blood pressure while you are pregnant. You will need follow-up visits after you have your baby.  High blood pressure that goes up during pregnancy when it was normal before. This is called gestational hypertension. It will often get better after you have your baby, but your doctor will need to watch your blood pressure to make sure that it is getting better.  You may develop high blood pressure after giving birth. This is called postpartum hypertension. This often occurs within 48 hours after childbirth but may occur up to 6 weeks after giving birth. Very high blood pressure during pregnancy is an emergency that needs treatment right away. How does this affect me? If you have high blood pressure during pregnancy, you have a higher chance of developing high blood pressure:  As you get older.  If you get pregnant again. In some cases, high blood pressure during pregnancy can cause:  Stroke.  Heart attack.  Damage to the kidneys, lungs, or liver.  Preeclampsia.  HELLP syndrome.  Seizures.  Problems with the placenta. How does this affect my baby? Your baby may:  Be born early.  Not weigh as much as he or she should.  Not handle labor well, leading to a C-section. This condition may also result in a baby's death before birth (stillbirth). What are the risks?  Having high blood pressure during a past pregnancy.  Being overweight.  Being age 35 or older.  Being pregnant  for the first time.  Being pregnant with more than one baby.  Becoming pregnant using fertility methods, such as IVF.  Having other problems, such as diabetes or kidney disease. What can I do to lower my risk?  Keep a healthy weight.  Eat a healthy diet.  Follow what your doctor tells you about treating any medical problems that you had before you got pregnant. It is very important to go to all of your doctor visits. Your doctor will check your blood pressure and make sure that your pregnancy is progressing as it should. Treatment should start early if a problem is found.   How is this treated? Treatment for high blood pressure during pregnancy can vary. It depends on the type of high blood pressure you have and how serious it is.  If you were taking medicine for your blood pressure before you got pregnant, talk with your doctor. You may need to change the medicine during pregnancy if it is not safe for your baby.  If your blood pressure goes up during pregnancy, your doctor may order medicine to treat this.  If you are at risk for preeclampsia, your doctor may tell you to take a low-dose aspirin while you are pregnant.  If you have very high blood pressure, you may need to stay in the hospital so you and your baby can be watched closely. You may also need to take medicine to lower your blood pressure.  In some cases, if your condition gets worse, you may need to have your baby early.   Follow these instructions at home: Eating and drinking  Drink enough fluid to keep your pee (urine) pale yellow.  Avoid caffeine.   Lifestyle  Do not smoke or use any products that contain nicotine or tobacco. If you need help quitting, ask your doctor.  Do not use alcohol or drugs.  Avoid stress.  Rest and get plenty of sleep.  Regular exercise can help. Ask your doctor what kinds of exercise are best for you. General instructions  Take over-the-counter and prescription medicines only as  told by your doctor.  Keep all prenatal and follow-up visits. Contact a doctor if:  You have symptoms that your doctor told you to watch for, such as: ? Headaches. ? A feeling like you may vomit (nausea). ? Vomiting. ? Belly (abdominal) pain. ? Feeling dizzy or light-headed. Get help right away if:  You have symptoms of serious problems, such as: ? Very bad belly pain that does not get better with treatment. ? A very bad headache that does not get better. ? Blurry vision. ? Double vision. ? Vomiting that does not get better. ? Sudden, fast weight gain. ? Sudden swelling in your hands, ankles, or face. ? Bleeding from your vagina. ? Blood in your pee. ? Shortness of breath. ? Chest pain. ? Weakness on one side of your body. ? Trouble talking.  Your baby is not moving as much as usual. These symptoms may be an emergency. Get help right away. Call your local emergency services (911 in the [place]  Do not wait to see if the symptoms will go away.  Do not drive yourself to the hospital. Summary  High blood pressure is also called hypertension.  High blood pressure means that the force of the blood moving in your body is high enough to cause problems for you and your baby.  Get help right away if you have symptoms of serious problems due to high blood pressure.  Keep all prenatal and follow-up visits. This information is not intended to replace advice given to you by your health care provider. Make sure you discuss any questions you have with your health care provider. Document Revised: 12/17/2019 Document Reviewed: 12/17/2019 Elsevier Patient Education  2021 [employer].  

## 2020-08-04 NOTE — Progress Notes (Signed)
Patient was assessed and managed by nursing staff during this encounter. I have reviewed the chart and agree with the documentation and plan. I have also made any necessary editorial changes.  Evelyna Folker, MD 08/04/2020 3:48 PM    

## 2020-08-08 ENCOUNTER — Other Ambulatory Visit: Payer: Self-pay

## 2020-08-08 ENCOUNTER — Ambulatory Visit (INDEPENDENT_AMBULATORY_CARE_PROVIDER_SITE_OTHER): Payer: Medicaid Other

## 2020-08-08 VITALS — BP 140/80 | HR 80

## 2020-08-08 DIAGNOSIS — Z0130 Encounter for examination of blood pressure without abnormal findings: Secondary | ICD-10-CM

## 2020-08-08 NOTE — Progress Notes (Signed)
Subjective:  Victoria Mills is a 25 y.o. female here for BP check.   Hypertension ROS: Taking medication as prescribed. No c/o chest pain,dizziness, blurred vision or swelling.    Objective:  There were no vitals taken for this visit.  Appearance alert, well appearing, and in no distress. General exam BP noted to be well controlled today in office.    Assessment:   Blood Pressure controlled   Plan:  Patient will follow up this afternoon via my chart with another blood pressure reading. Continue medications as prescribed.

## 2020-08-15 ENCOUNTER — Telehealth: Payer: Self-pay

## 2020-08-15 NOTE — Telephone Encounter (Signed)
TC to patient to follow-up on Blood pressure from 08/08/2020 message was sent to Triage but routed to my inbox.No answer or voice mail to leave a message.

## 2020-08-29 ENCOUNTER — Encounter: Payer: Self-pay | Admitting: Family Medicine

## 2020-08-29 ENCOUNTER — Other Ambulatory Visit: Payer: Self-pay

## 2020-08-29 ENCOUNTER — Ambulatory Visit (INDEPENDENT_AMBULATORY_CARE_PROVIDER_SITE_OTHER): Payer: Medicaid Other | Admitting: Family Medicine

## 2020-08-29 DIAGNOSIS — J4520 Mild intermittent asthma, uncomplicated: Secondary | ICD-10-CM

## 2020-08-29 NOTE — Progress Notes (Signed)
Post Partum Visit Note  Dvora Buitron is a 25 y.o. [phone] female who presents for a postpartum visit. She is 4 weeks postpartum following a normal spontaneous vaginal delivery.  I have fully reviewed the prenatal and intrapartum course. The delivery was at 37.1 gestational weeks.  Anesthesia: epidural. Postpartum course has been unremarkable. Baby is doing well. Baby is feeding by breast. Bleeding staining only. Bowel function is normal. Bladder function is normal. Patient is not sexually active. Contraception method is Nexplanon. Postpartum depression screening: negative=3.   The pregnancy intention screening data noted above was reviewed. Potential methods of contraception were discussed. The patient elected to proceed with Hormonal Implant.    Edinburgh Postnatal Depression Scale - 08/29/20 1128      Edinburgh Postnatal Depression Scale:  In the Past 7 Days   I have been able to laugh and see the funny side of things. 0    I have looked forward with enjoyment to things. 0    I have blamed myself unnecessarily when things went wrong. 0    I have been anxious or worried for no good reason. 2    I have felt scared or panicky for no good reason. 0    Things have been getting on top of me. 0    I have been so unhappy that I have had difficulty sleeping. 0    I have felt sad or miserable. 1    I have been so unhappy that I have been crying. 0    The thought of harming myself has occurred to me. 0    Edinburgh Postnatal Depression Scale Total 3           Health Maintenance Due  Topic Date Due  . HPV VACCINES (1 - 2-dose series) Never done  . COVID-19 Vaccine (3 - Booster for Pfizer series) 08/07/2020    The following portions of the patient's history were reviewed and updated as appropriate: allergies, current medications, past family history, past medical history, past social history, past surgical history and problem list.  Review of Systems Pertinent items noted in HPI and  remainder of comprehensive ROS otherwise negative.  Objective:  There were no vitals taken for this visit.   General:  alert, cooperative and appears stated age  Lungs: normal effort  Heart:  regular rate and rhythm  Abdomen: soft, non-tender; bowel sounds normal; no masses,  no organomegaly        Assessment:   Normal postpartum exam.   Plan:   Essential components of care per ACOG recommendations:  1.  Mood and well being: Patient with negative depression screening today. Reviewed local resources for support.  - Patient tobacco use? No.   - hx of drug use? No.    2. Infant care and feeding:  -Patient currently breastmilk feeding? Yes. Reviewed importance of draining breast regularly to support lactation. works from home -Social determinants of health (SDOH) reviewed in [place]. No concerns  3. Sexuality, contraception and birth spacing - Patient does not want a pregnancy in the next year.  Desired family size is 3 children.  - Reviewed forms of contraception in tiered fashion. Patient desired Nexplanon, placed following delivery today.   - Discussed birth spacing of 18 months  4. Sleep and fatigue -Encouraged family/partner/community support of 4 hrs of uninterrupted sleep to help with mood and fatigue  5. Physical Recovery  - Discussed patients delivery and complications. She describes her labor as good. - Patient had a Vaginal,  no problems at delivery. Patient had a no laceration. Perineal healing reviewed. Patient expressed understanding - Patient has urinary incontinence? No. - Patient is safe to resume physical and sexual activity  6.  Health Maintenance - HM due items addressed Yes - Last pap smear 12/2017 in media Pap smear not done at today's visit.  -Breast Cancer screening indicated? No.   7. Chronic Disease/Pregnancy Condition follow up: None   [facility] for [employer], [facility]

## 2021-10-03 ENCOUNTER — Encounter: Payer: Self-pay | Admitting: Student

## 2021-10-03 ENCOUNTER — Ambulatory Visit (INDEPENDENT_AMBULATORY_CARE_PROVIDER_SITE_OTHER): Payer: Medicaid Other | Admitting: Student

## 2021-10-03 VITALS — BP 128/80 | Ht 63.0 in | Wt 174.4 lb

## 2021-10-03 DIAGNOSIS — Z3046 Encounter for surveillance of implantable subdermal contraceptive: Secondary | ICD-10-CM

## 2021-10-03 NOTE — Progress Notes (Signed)
   History:  Victoria Mills is a 26 y.o. [phone] who presents to clinic today for nexplanon removal.   Patient has had implant in place since April 2022 and reports that she prefers to have a period every month. Had breakthrough spotting last week for the first time that was bothersome to patient. Patient does not want to initiate a different method of contraception at this time and wants a "break" from birth control.   The following portions of the patient's history were reviewed and updated as appropriate: allergies, current medications, family history, past medical history, social history, past surgical history and problem list.  Review of Systems:  Review of Systems  All other systems reviewed and are negative.    Objective:  Physical Exam BP 128/80   Ht 5\' 3"  (1.6 m)   Wt 174 lb 6.4 oz (79.1 kg)   LMP 09/26/2021 (Exact Date)   BMI 30.89 kg/m  Physical Exam Vitals and nursing note reviewed. Exam conducted with a chaperone present.  Constitutional:      Appearance: Normal appearance.  Cardiovascular:     Rate and Rhythm: Normal rate.  Pulmonary:     Effort: Pulmonary effort is normal.  Abdominal:     General: Abdomen is flat.  Neurological:     Mental Status: She is alert and oriented to person, place, and time.  Psychiatric:        Mood and Affect: Mood normal.        Behavior: Behavior normal.     Labs and Imaging No results found for this or any previous visit (from the past 24 hour(s)).  No results found.  Health Maintenance Due  Topic Date Due   COVID-19 Vaccine (3 - Pfizer series) 05/04/2020   PAP-Cervical Cytology Screening  12/18/2020   PAP SMEAR-Modifier  12/18/2020    Labs, imaging and previous visits in Epic and Care Everywhere reviewed  Assessment & Plan:  1. Encounter for Nexplanon removal - Counseled on procedure, consent signed, see procedure note - Reviewed other forms of birth control options available. No questions or desire for  alternative method at this time - Nexplanon safely removed by Dr. Catalina Antigua. -at home care instructions for incision site provided to patient  F/U as needed or for annual visit.  Corlis Hove, NP 10/03/2021 9:21 AM

## 2021-10-03 NOTE — Progress Notes (Signed)
Pt presents for Nexplanon removal. Nexplanon inserted 07/2020. Pt states she has not had a period in nearly a year until last week, but has PMS symptoms monthly. Prefers method with regular, monthly cycles. Unsure which method she would like to start today.

## 2022-02-19 IMAGING — US US MFM OB FOLLOW-UP
1 series · 14 of 28 positions shown · non-contrast
Comparison: none

[Series 1: us mfm ob follow-up · 64 acquisitions, 14 frames shown]
[im 3/64]
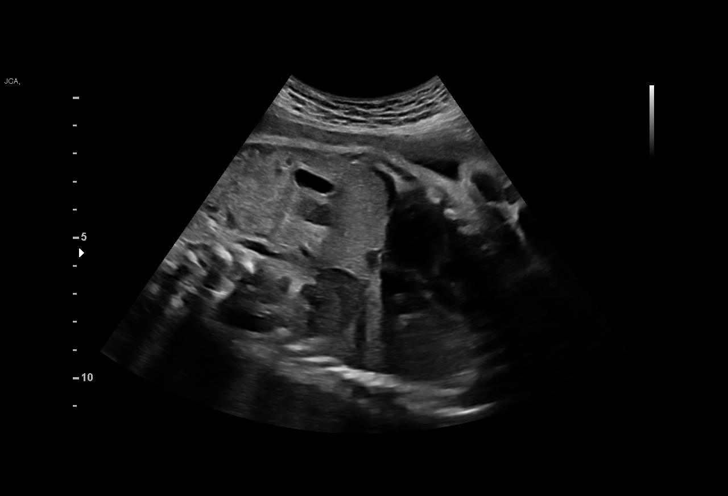
[im 8/64]
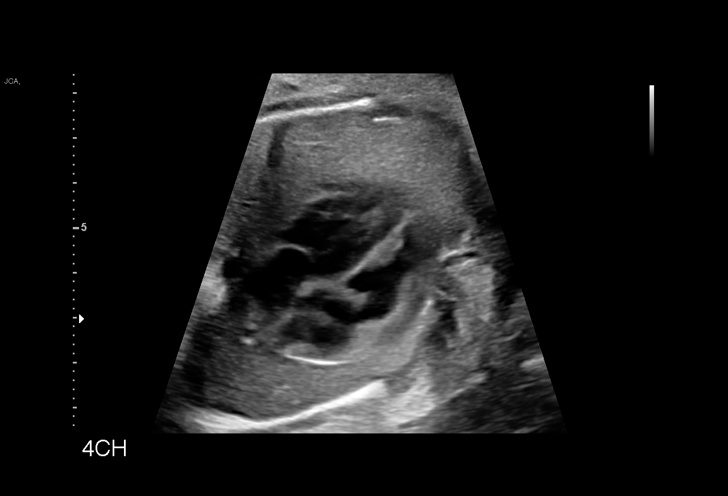
[im 12/64]
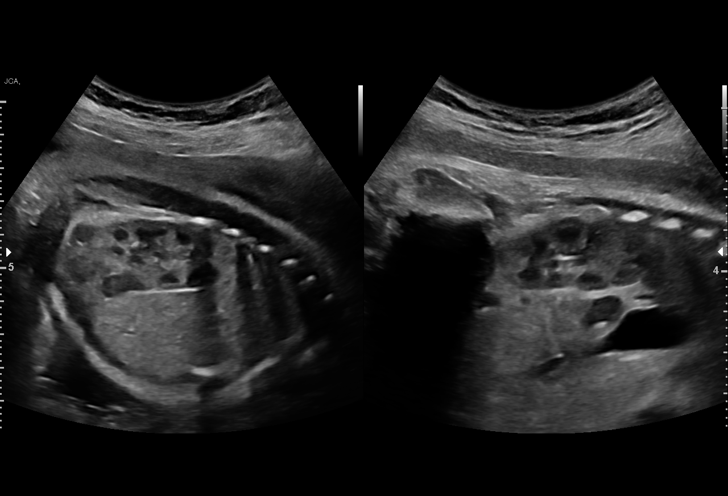
[im 17/64]
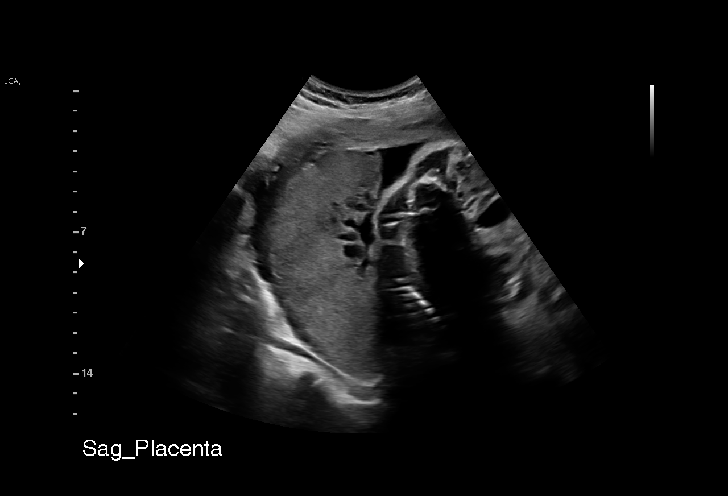
[im 22/64]
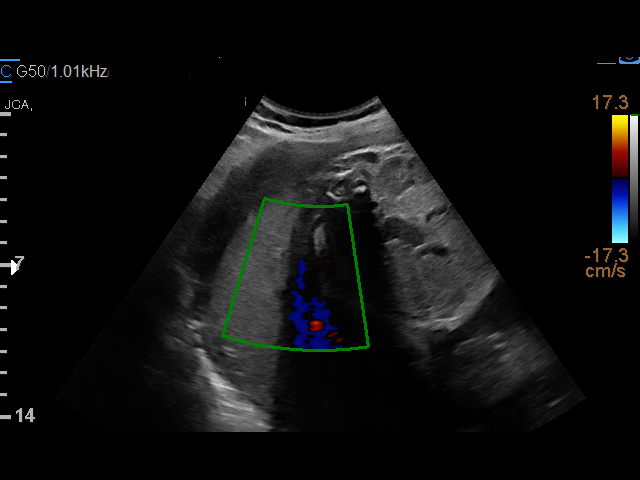
[im 26/64]
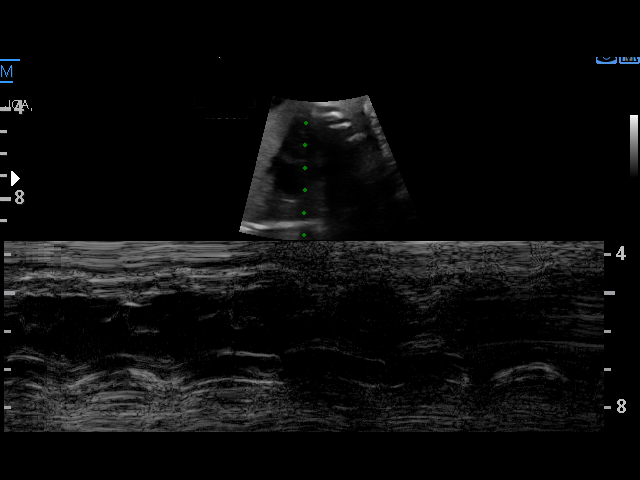
[im 31/64]
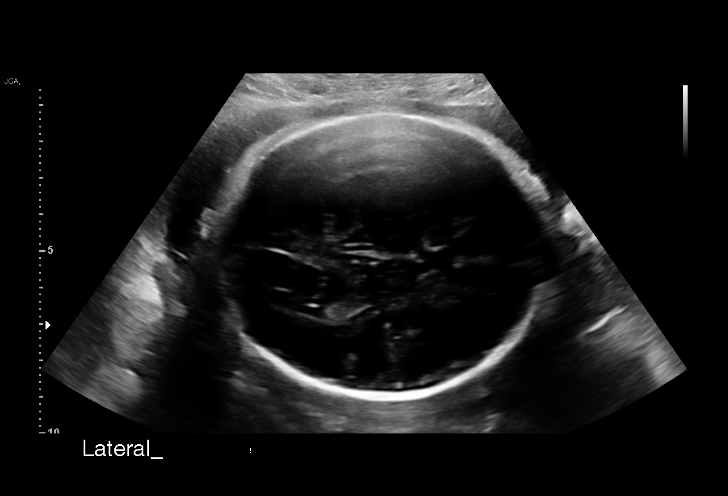
[im 36/64]
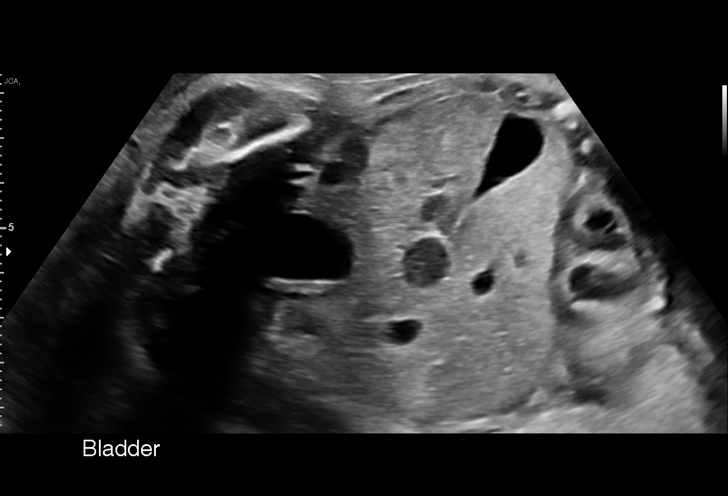
[im 40/64]
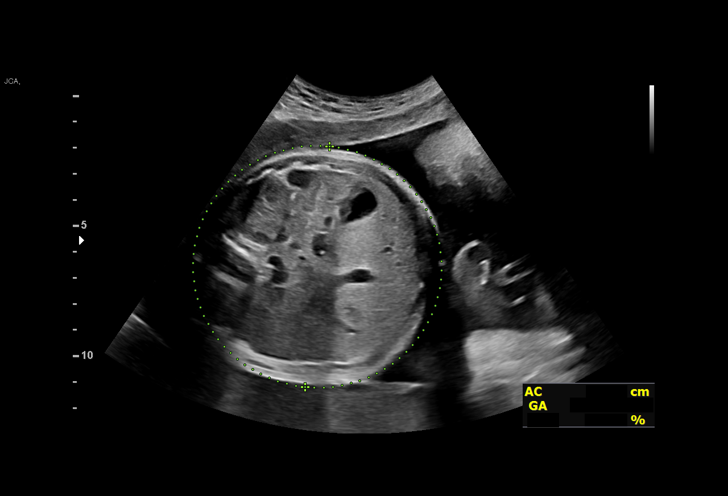
[im 45/64]
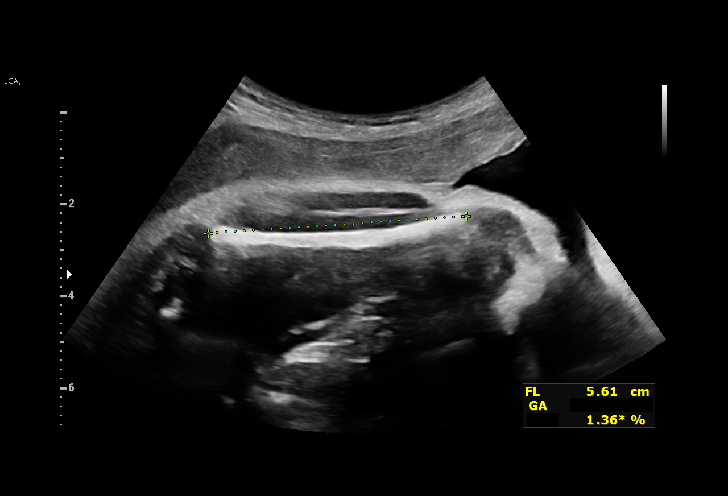
[im 50/64]
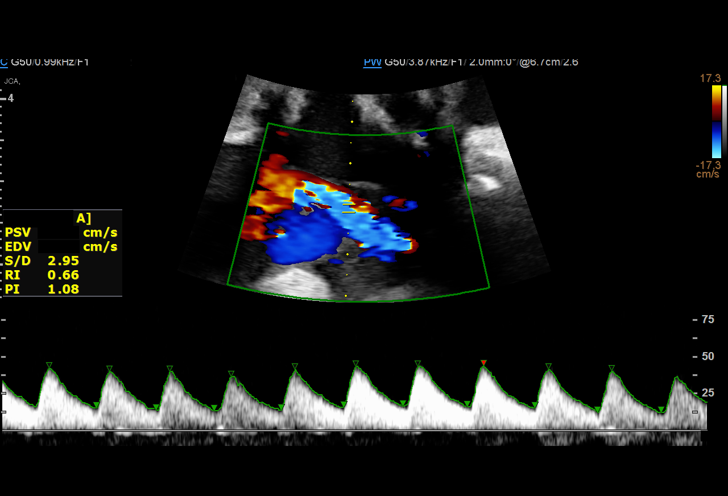
[im 54/64]
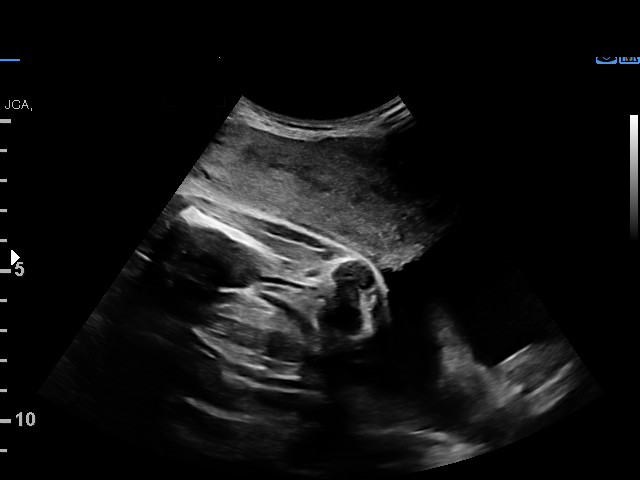
[im 59/64]
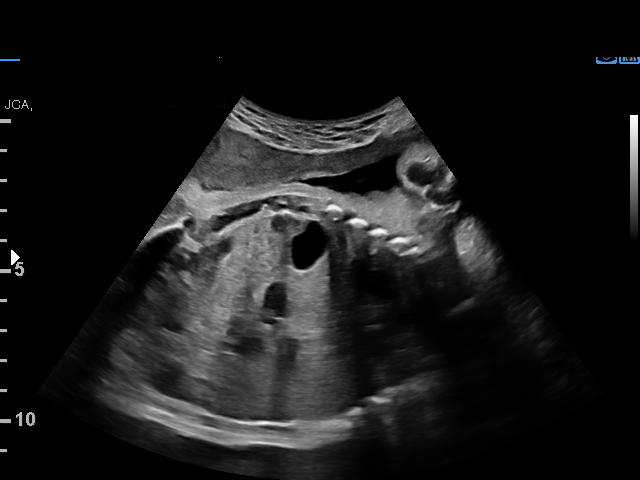
[im 64/64]
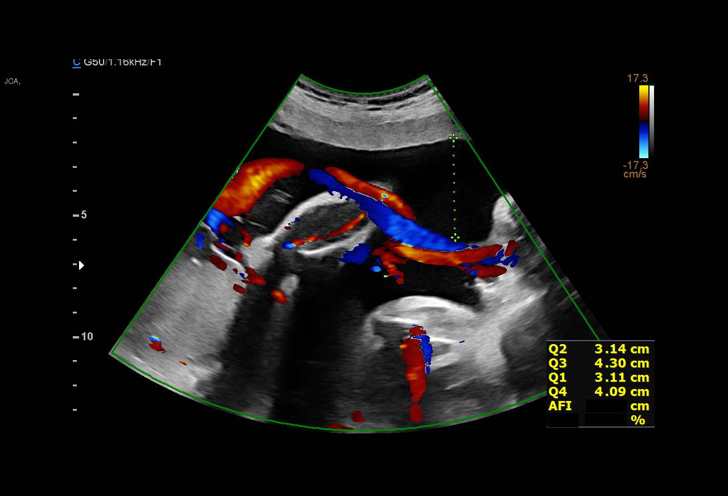

[14 of 28 positions shown; findings below may reference images not displayed]

[REDACTED]care

Indications

 Poor obstetric history: Previous
 preeclampsia / eclampsia/gestational HTN
 Poor obstetric history: Previous preterm
 delivery, antepartum (29 week induction)
 Asthma                                         ZVV.IV j37.535
 Encounter for other antenatal screening
 follow-up
 32 weeks gestation of pregnancy
Fetal Evaluation

 Num Of Fetuses:         1
 Fetal Heart Rate(bpm):  150
 Cardiac Activity:       Observed
 Presentation:           Cephalic
 Placenta:               Posterior
 P. Cord Insertion:      Previously Visualized

 Amniotic Fluid
 AFI FV:      Within normal limits

 AFI Sum(cm)     %Tile       Largest Pocket(cm)
 14.64           51
 RUQ(cm)       RLQ(cm)       LUQ(cm)        LLQ(cm)

Biometry

 BPD:      77.3  mm     G. Age:  31w 0d         15  %    CI:        75.45   %    70 - 86
                                                         FL/HC:      20.1   %    19.1 -
 HC:      282.2  mm     G. Age:  31w 0d          3  %    HC/AC:      0.95        0.96 -
 AC:      296.4  mm     G. Age:  33w 4d         89  %    FL/BPD:     73.2   %    71 - 87
 FL:       56.6  mm     G. Age:  29w 5d          2  %    FL/AC:      19.1   %    20 - 24
 HUM:      55.2  mm     G. Age:  32w 1d         52  %
 LV:        2.5  mm

 Est. FW:    0763  gm      4 lb 2 oz     38  %
OB History

 Blood Type:   O+
 Gravidity:    4         Term:   2        Prem:   1
 Living:       2
Gestational Age

 LMP:           32w 0d        Date:  11/13/19                 EDD:   08/19/20
 U/S Today:     31w 2d                                        EDD:   08/24/20
 Best:          32w 0d     Det. By:  LMP  (11/13/19)          EDD:   08/19/20
Anatomy

 Ventricles:            Appears normal         Stomach:                Appears normal, left
                                                                       sided
 Thoracic:              Appears normal         Kidneys:                Appear normal
 Heart:                 Appears normal         Bladder:                Appears normal
                        (4CH, axis, and
                        situs)
 Diaphragm:             Appears normal
Doppler - Fetal Vessels

 Umbilical Artery
  S/D     %tile      RI    %tile      PI    %tile            ADFV    RDFV
  2.55       42    0.61       50     0.9       50               No      No

Impression

 Follow up growth due to prior FGR now resolved.
 Normal interval growth with measurements consistent with
 dates
 Good fetal movement and amniotic fluid volume
Recommendations

 Follow up growth in 4 weeks given FGR in early pregnancy.

## 2022-03-23 IMAGING — US US MFM FETAL BPP W/O NON-STRESS
1 series · 12 of 28 positions shown · non-contrast
Comparison: none

[Series 1: us mfm fetal bpp w/o non-stress · 38 acquisitions, 12 frames shown]
[im 2/38]
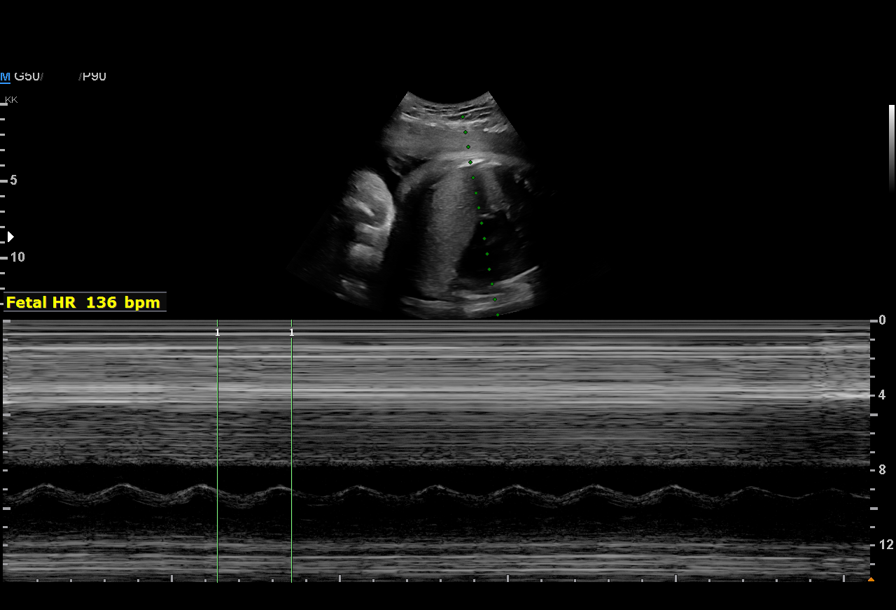
[im 5/38]
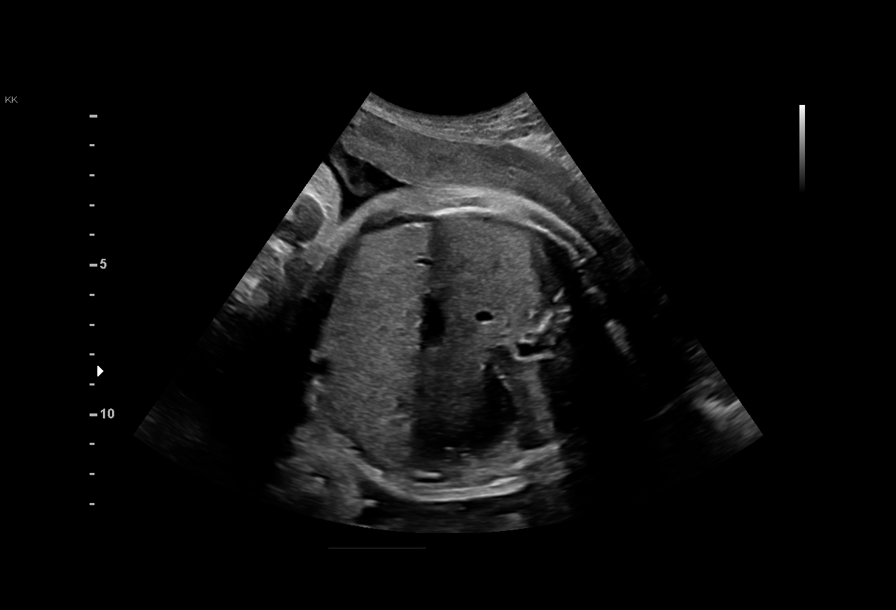
[im 7/38]
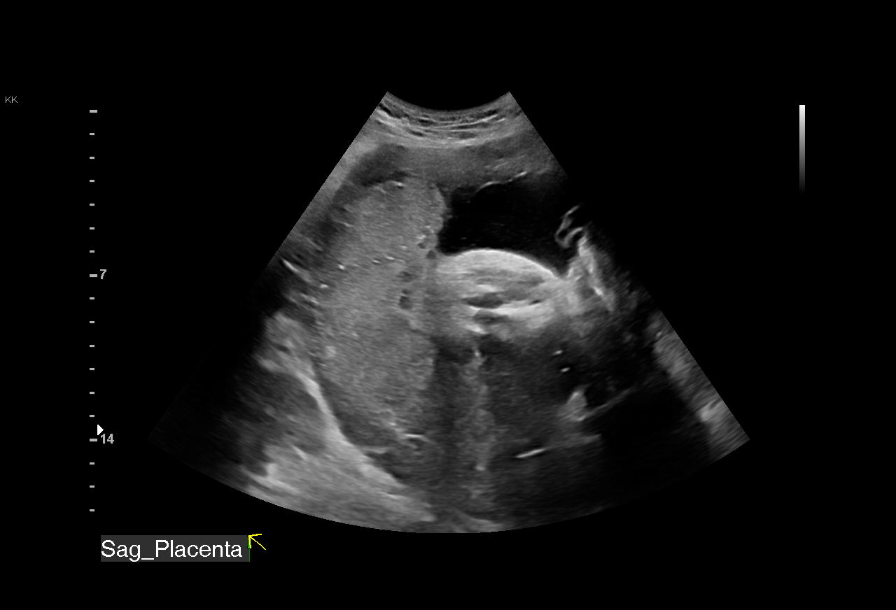
[im 11/38]
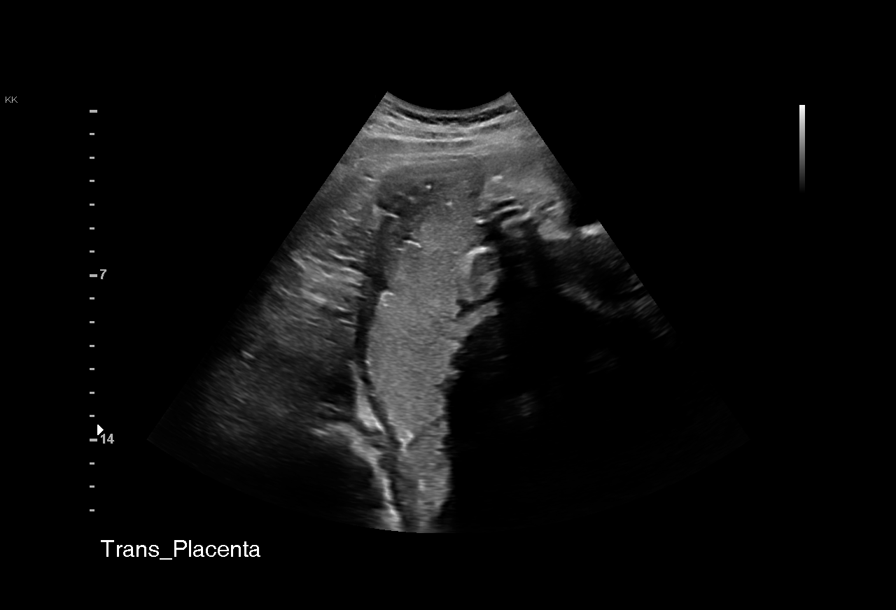
[im 14/38]
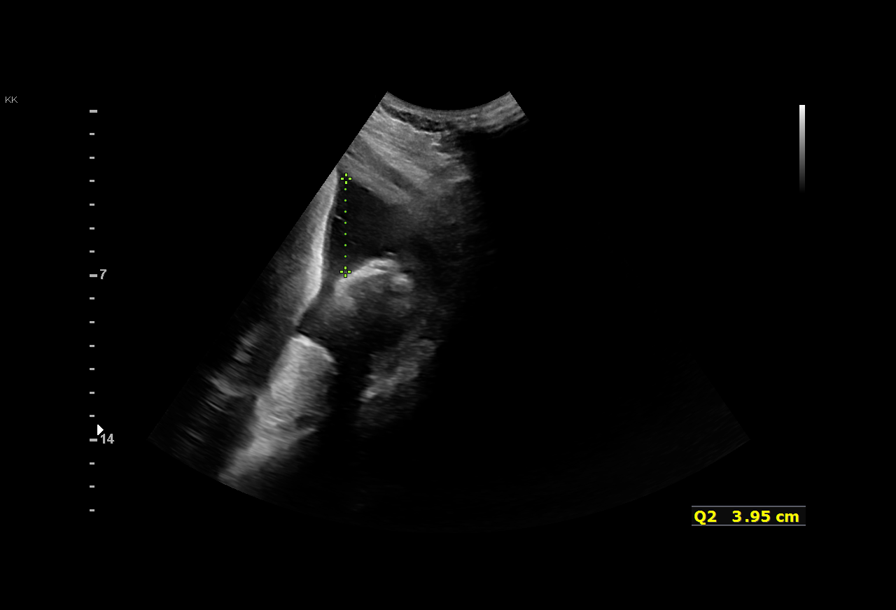
[im 17/38]
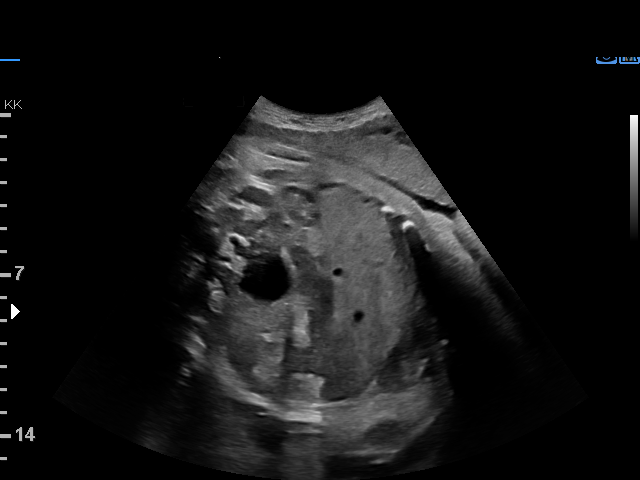
[im 21/38]
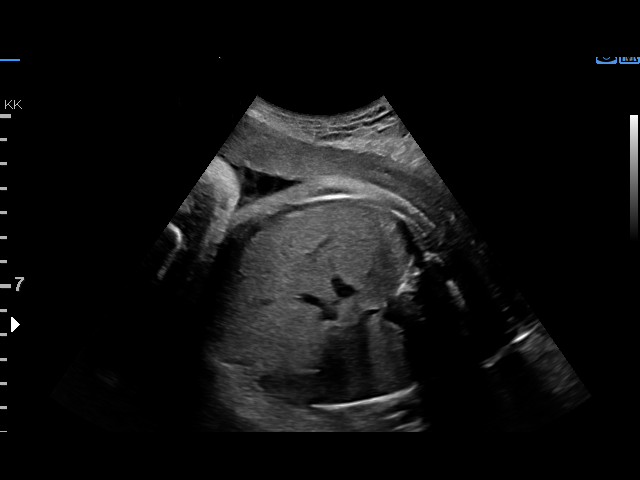
[im 24/38]
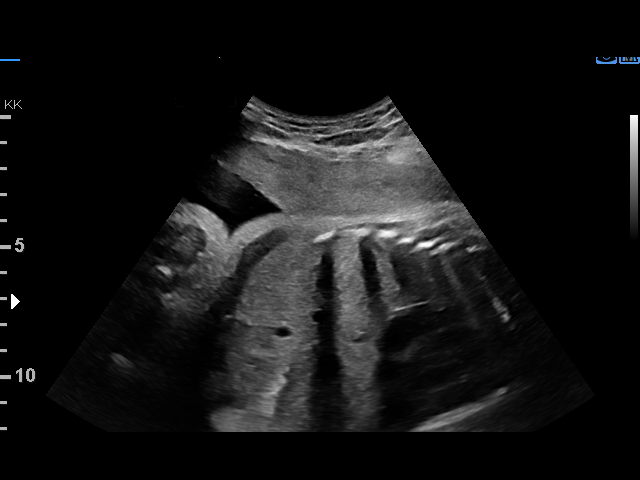
[im 27/38]
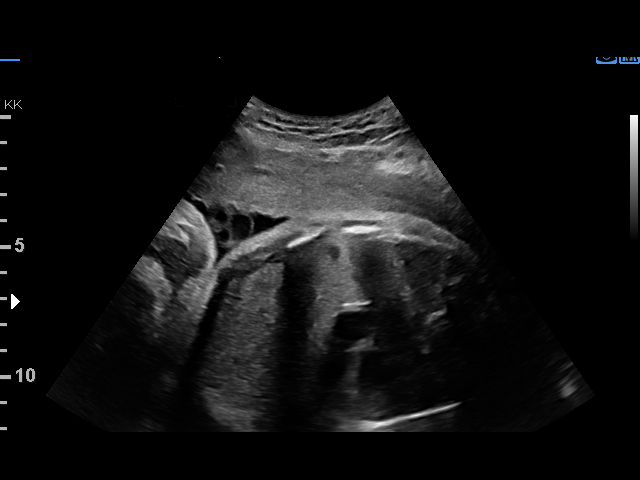
[im 31/38]
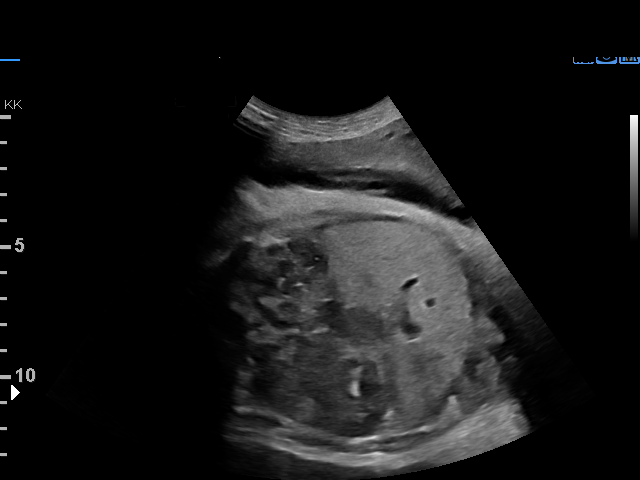
[im 33/38]
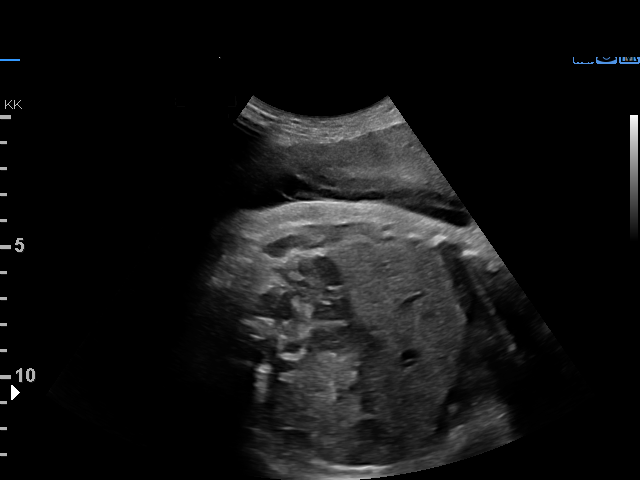
[im 36/38]
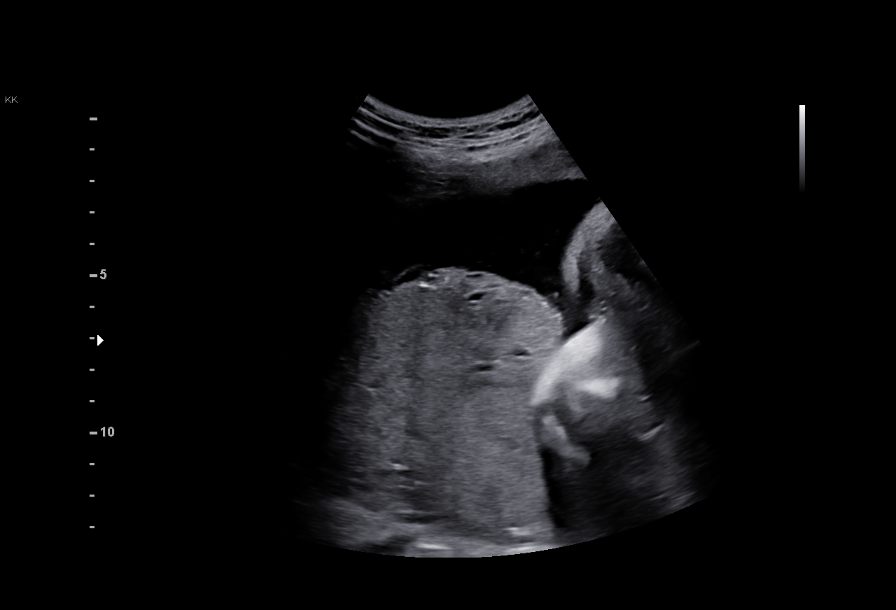

[12 of 28 positions shown; findings below may reference images not displayed]

[REDACTED]care

Indications

 Hypertension - Gestational (No Meds)
 36 weeks gestation of pregnancy
 Poor obstetric history: Previous
 preeclampsia / eclampsia/gestational HTN
 Poor obstetric history: Previous preterm
 delivery, antepartum (29 week induction)
 Asthma                                         P55.J5 j49.040
Fetal Evaluation

 Num Of Fetuses:         1
 Fetal Heart Rate(bpm):  136
 Cardiac Activity:       Observed
 Presentation:           Cephalic
 Placenta:               Posterior
 P. Cord Insertion:      Previously Visualized

 Amniotic Fluid
 AFI FV:      Within normal limits

 AFI Sum(cm)     %Tile       Largest Pocket(cm)
 13.2            47

 RUQ(cm)       RLQ(cm)       LUQ(cm)        LLQ(cm)
 4.5           1.2           4
Biophysical Evaluation

 Amniotic F.V:   Pocket => 2 cm             F. Tone:        Observed
 F. Movement:    Observed                   Score:          [DATE]
 F. Breathing:   Observed
OB History

 Blood Type:   O+
 Gravidity:    4         Term:   2        Prem:   1
 Living:       2
Gestational Age

 LMP:           36w 4d        Date:  11/13/19                 EDD:   08/19/20
 Best:          36w 4d     Det. By:  LMP  (11/13/19)          EDD:   08/19/20
Anatomy

 Stomach:               Appears normal, left   Bladder:                Appears normal
                        sided
Comments

 This patient was seen for a biophysical profile due to
 gestational hypertension.  Her blood pressures in our office
 today were 147/76 and 140/80.  She denies any signs or
 symptoms of preeclampsia.
 A biophysical profile performed today was [DATE].
 There was normal amniotic fluid noted on today's ultrasound
 exam.
 Due to gestational hypertension, she already has an
 induction of labor scheduled in 3 days.  Preeclampsia
 precautions were reviewed today.
 No further exams were scheduled in our office.
